# Patient Record
Sex: Male | Born: 1940 | Race: White | Hispanic: No | Marital: Married | Smoking: Former smoker
Health system: Southern US, Community
[De-identification: ages and names within clinical notes are randomized; demographics above are authoritative.]

## PROBLEM LIST (undated history)

## (undated) DIAGNOSIS — E785 Hyperlipidemia, unspecified: Secondary | ICD-10-CM

## (undated) DIAGNOSIS — I10 Essential (primary) hypertension: Secondary | ICD-10-CM

## (undated) DIAGNOSIS — I2510 Atherosclerotic heart disease of native coronary artery without angina pectoris: Secondary | ICD-10-CM

## (undated) DIAGNOSIS — I219 Acute myocardial infarction, unspecified: Secondary | ICD-10-CM

## (undated) DIAGNOSIS — Z9289 Personal history of other medical treatment: Secondary | ICD-10-CM

## (undated) HISTORY — DX: Atherosclerotic heart disease of native coronary artery without angina pectoris: I25.10

## (undated) HISTORY — DX: Hyperlipidemia, unspecified: E78.5

## (undated) HISTORY — DX: Personal history of other medical treatment: Z92.89

## (undated) HISTORY — DX: Essential (primary) hypertension: I10

## (undated) HISTORY — DX: Acute myocardial infarction, unspecified: I21.9

---

## 1990-12-20 HISTORY — PX: CORONARY ARTERY BYPASS GRAFT: SHX141

## 1999-12-07 ENCOUNTER — Emergency Department (HOSPITAL_COMMUNITY): Admission: EM | Admit: 1999-12-07 | Discharge: 1999-12-07 | Payer: Self-pay | Admitting: *Deleted

## 2000-05-23 ENCOUNTER — Ambulatory Visit (HOSPITAL_COMMUNITY): Admission: RE | Admit: 2000-05-23 | Discharge: 2000-05-23 | Payer: Self-pay | Admitting: Gastroenterology

## 2005-01-07 ENCOUNTER — Encounter: Admission: RE | Admit: 2005-01-07 | Discharge: 2005-01-07 | Payer: Self-pay | Admitting: Cardiovascular Disease

## 2005-10-02 IMAGING — CR DG CHEST 2V
2 series · 2 of 2 positions shown · non-contrast
Comparison: none

CLINICAL DATA: Pre-cath. 
 CHEST X-RAY: 
 Two views of the chest show the lungs to be clear.  There is mild cardiomegaly present.  Median sternotomy sutures are noted from prior CABG.

[view not recorded (1 of 2)]
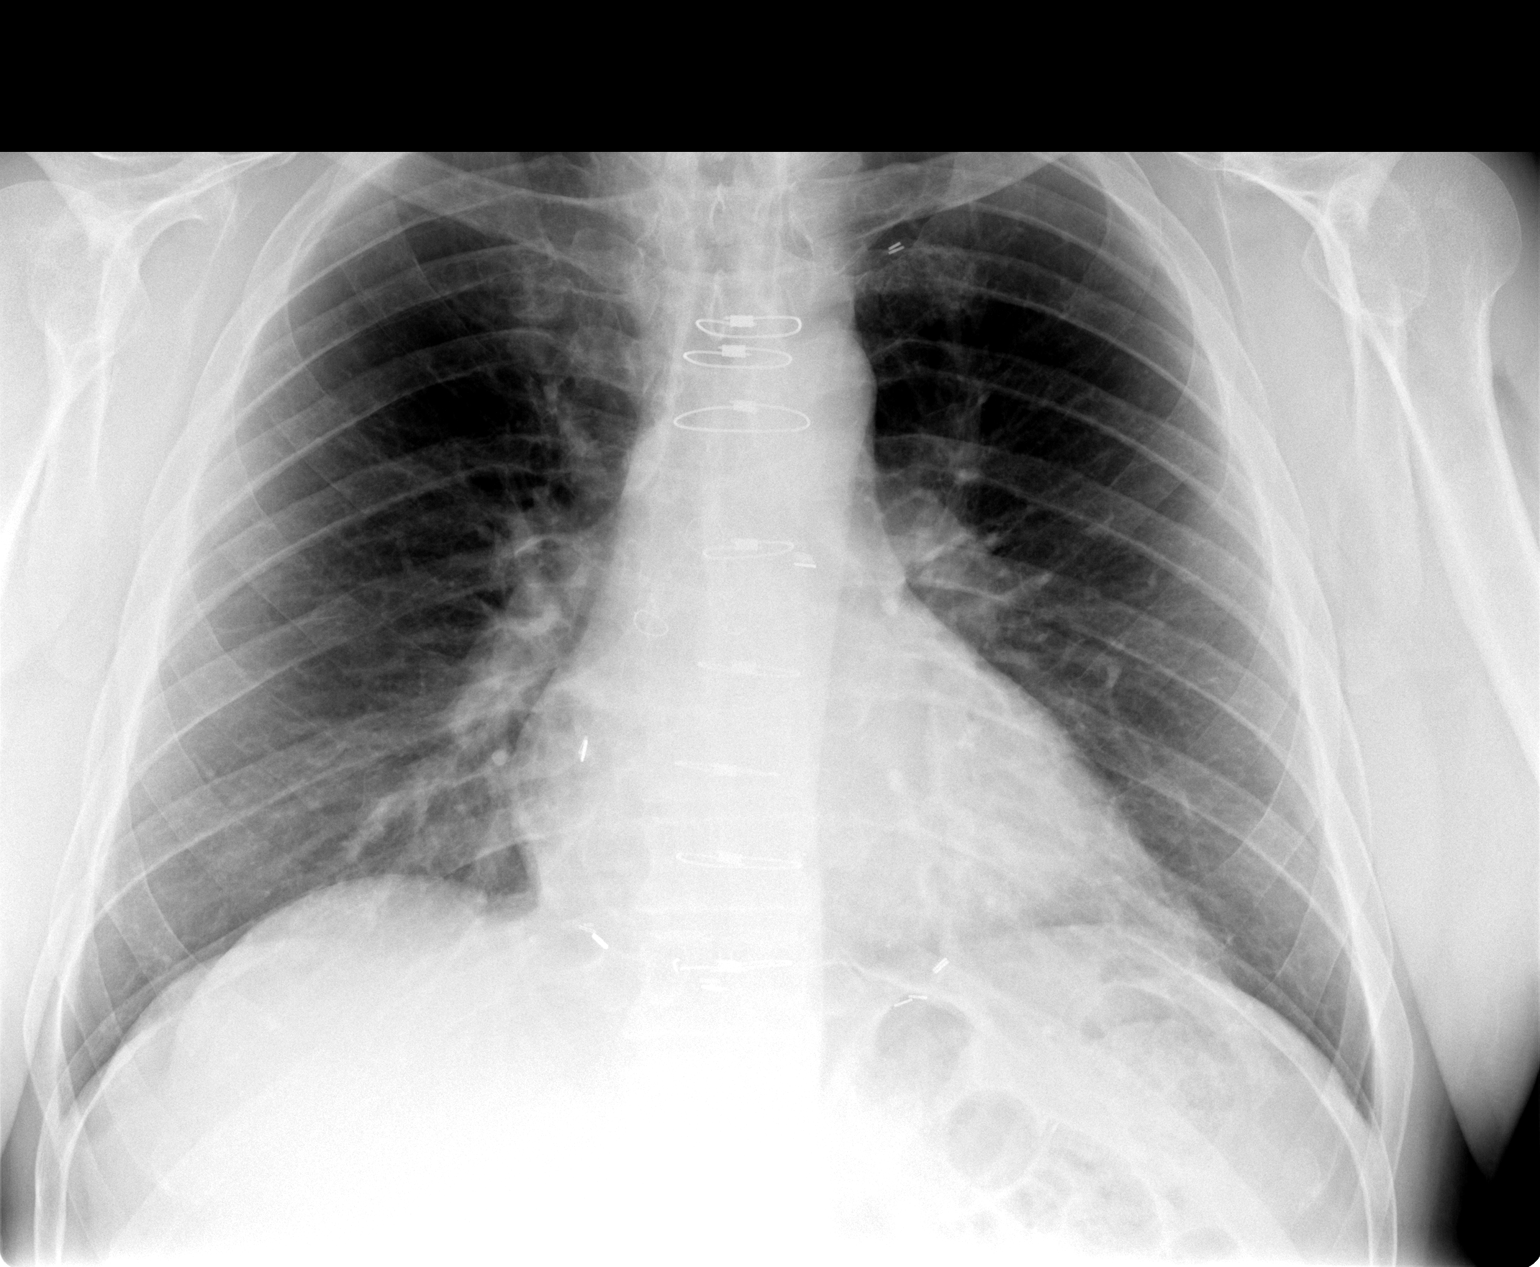

[view not recorded (2 of 2)]
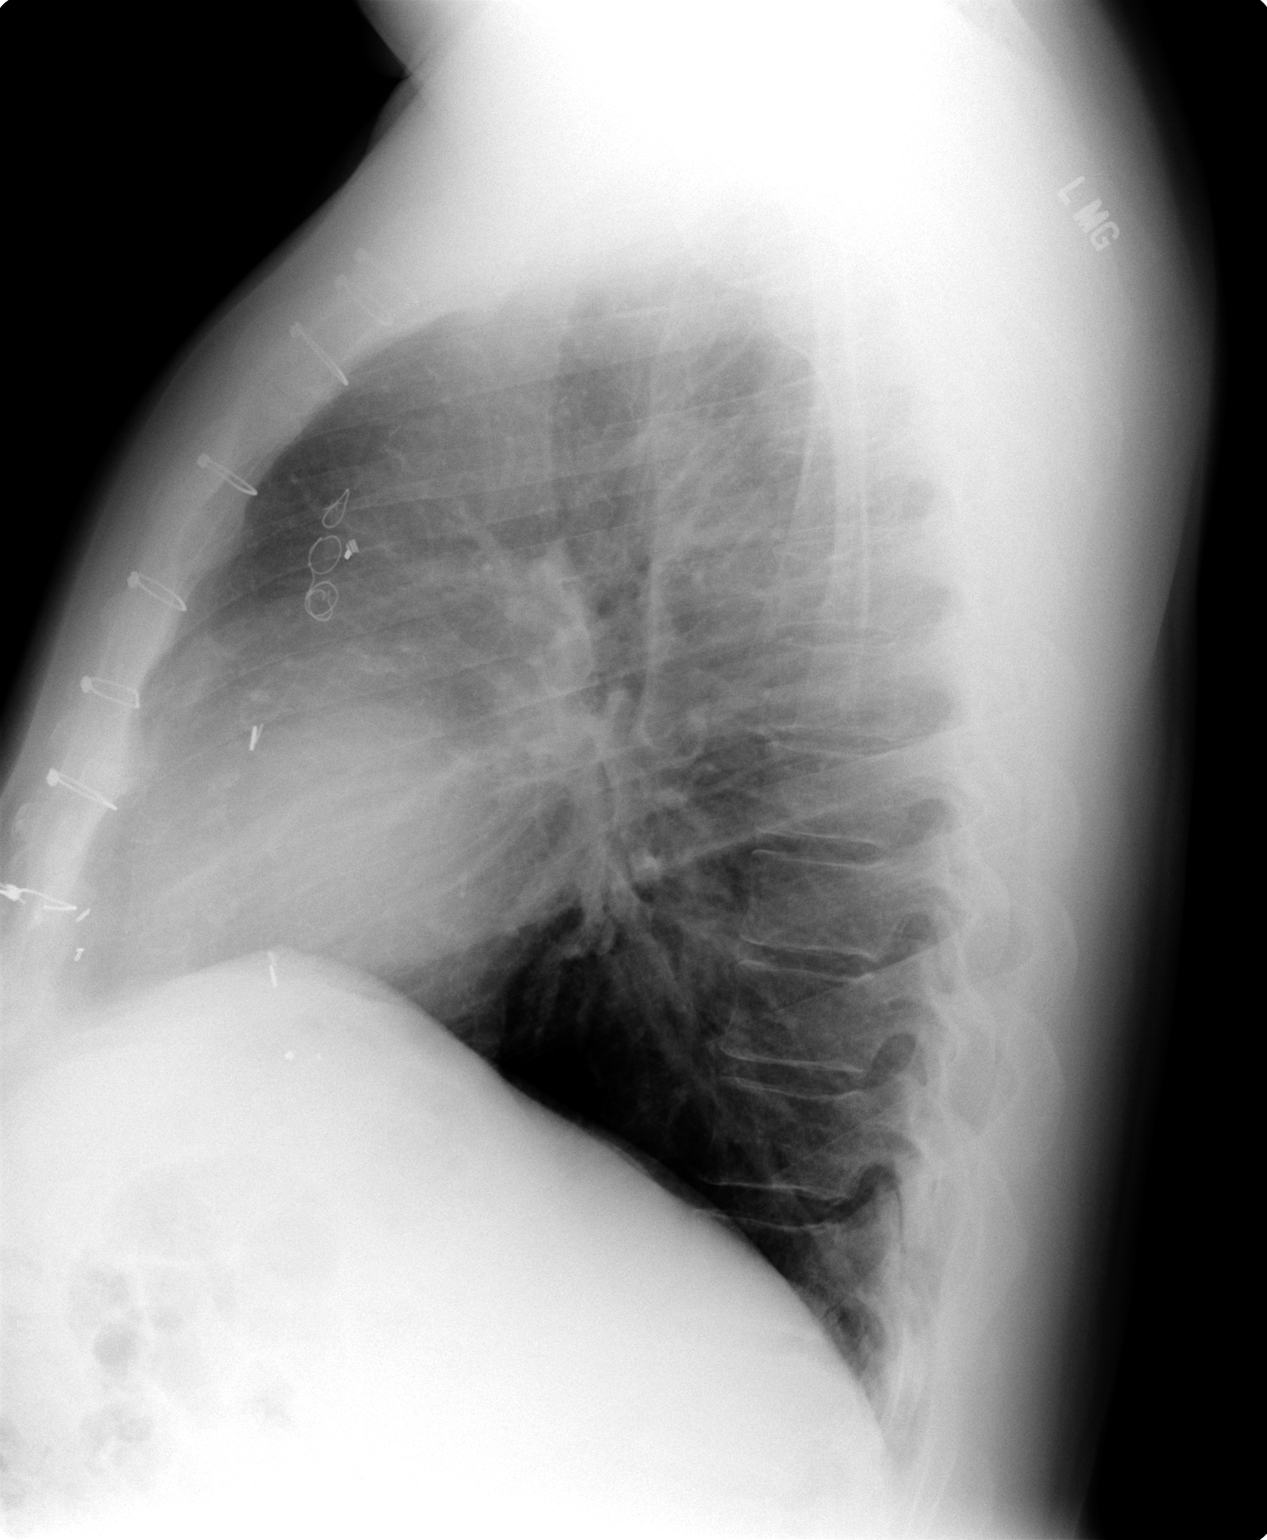

[2 of 2 positions shown; findings below may reference images not displayed]

IMPRESSION: No active lung disease.  Borderline cardiomegaly.

## 2005-12-20 HISTORY — PX: CARDIAC CATHETERIZATION: SHX172

## 2006-03-10 ENCOUNTER — Inpatient Hospital Stay (HOSPITAL_COMMUNITY): Admission: AD | Admit: 2006-03-10 | Discharge: 2006-03-12 | Payer: Self-pay | Admitting: Cardiology

## 2008-08-20 DIAGNOSIS — Z9289 Personal history of other medical treatment: Secondary | ICD-10-CM

## 2008-08-20 HISTORY — DX: Personal history of other medical treatment: Z92.89

## 2008-08-25 ENCOUNTER — Emergency Department (HOSPITAL_BASED_OUTPATIENT_CLINIC_OR_DEPARTMENT_OTHER): Admission: EM | Admit: 2008-08-25 | Discharge: 2008-08-25 | Payer: Self-pay | Admitting: Emergency Medicine

## 2009-06-19 DIAGNOSIS — Z9289 Personal history of other medical treatment: Secondary | ICD-10-CM

## 2009-06-19 HISTORY — DX: Personal history of other medical treatment: Z92.89

## 2011-05-07 NOTE — Cardiovascular Report (Signed)
NAME:  Carl Keith, Carl Keith NO.:  [id number]   MEDICAL RECORD NO.:  [id number]          PATIENT TYPE:  INP   LOCATION:  6522                         FACILITY:  [facility]   PHYSICIAN:  Nicki Guadalajara, M.D.     DATE OF BIRTH:  December 05, 1941   DATE OF PROCEDURE:  03/11/2006  DATE OF DISCHARGE:  03/12/2006                              CARDIAC CATHETERIZATION   INDICATIONS:  Mr. Carl Keith is a very pleasant 70 year old gentleman  who underwent CABG surgery x4 in 1992 by Dr. Andrey Campanile. In January 2006, after  a Cardiolite study suggested inferolateral ischemia in the apical  inferolateral segment which was new compared to 2003, he went repeat cardiac  catheterization at the [facility]. This showed preserved global  contractility with an EF of 53% with a small focal region of apical distal  inferior hypocontractility. He has significant native coronary artery  disease with ectasia of his left main, total occlusion of his proximal LAD  after a large bifurcating diagonal vessel and intermediate like vessel,  occlusion of his marginals rise from his native circumflex, and total  occlusion of his proximal right coronary artery. He had patent vein graft  supplying a large bifurcating diagonal system proximally. He had a patent  vein graft supplying the distal diagonal LAD system but had diffuse  irregularity beyond the graft insertion in the LAD with total occlusion of  the LAD distally with faint collateralization to the LAD portion. He had an  occluded third graft at its origin from the aorta of questionable insertion  site and he had a patent vein graft supplying the PDA but had diffuse  ectasia with distal stenosis of 50-60% and 90% in the mid-PDA stenosis, a  small caliber vessel. He was treated with medical therapy and continued to  do exceptionally well. Apparently two days ago, he developed new recurrent  discomfort with chest pain between his shoulder blades which  led to  admission to a [facility]. He ultimately ruled in for a non-Q MI.  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   PROCEDURE:  After premedication with Valium 5 mg intravenously, the patient  was prepped and draped in the usual fashion. His right femoral artery was  punctured anteriorly and a 5-French sheath was inserted. Diagnostic  catheterization was done with a 5-French Judkins #4 left and right  diagnostic catheters. A right catheter was used for selective angiography  into three of the vein grafts. A right bypass catheter was used for  selective angiography in the saphenous vein to the RCA. This was also used  for selective angiography to the left subclavian system for which a LIMA  could not be seen. A 5-French pigtail catheter was used for biplane cine  left ventriculography. Since the patient had recently had distal aortography  14 months ago, this was not repeated. At this point, I broke scrub. I did  review the cineangiograms with the patient and discussed the therapeutic  options. I also reviewed the angiographic data with Dr. Allyson Sabal and after some  discussion decision was made to attempt coronary intervention. At that time,  the staff had already begun to break sterility in the room. Consequently,  new system was set up and discussions were made with the patient to who  consented to have attempt at coronary intervention. The arterial sheath was  then exchanged for a 6-French sheath. A 6-French right bypass guide was used  for the intervention. An ATW marker wire was advanced down the vein graft to  the PDA vessel after the patient had been administered Integrilin bolus as  well as weight-adjusted heparinization with documented therapeutic ACT of  296.  He also had received 300 mg of oral Plavix. The patient also  received  an additional bolus of Integrilin and was begun on an Integrilin drip during  the procedure. Predilatation was done with a 2.0 x 15 mm Maverick balloon in  the PDA vessel beyond the anastomosis site to cover several areas of  stenosis and ectasia. It was felt that this was stentable. The patient did  receive numerous doses of intracoronary nitroglycerin. Following dilatation,  the vessel was large enough to stent and a 2.5 x 23 mm drug-eluting Cypher  stent was then successfully deployed and positioned just beyond the  anastomosis site to cover the ectatic segments and the sequential stenoses  of 50-70% and greater than 95%. Poststent dilatation was done utilizing a  2.75 x 15 mm Quantum balloon with tapering of the vessel from 2.75 mm  immediately after the anastomosis to 2.69 mm at the distal portion of the  stent. There was TIMI III flow. There was no evidence for dissection. The  patient tolerated the procedure well.   HEMODYNAMIC DATA:  Central aortic pressure was 125/66. Left ventricular  pressure was 125/16.   ANGIOGRAPHIC DATA:  The left main coronary artery was markedly ectatic and  bifurcated into a proximal LAD and circumflex system. There was 30-40%  ostial narrowing of the left main with 40-50% narrowing distally.   The LAD was ectatic proximally and then filling was seen into the  bifurcating diagonal system.  The LAD was not visualized beyond this.   The circumflex vessel was small to moderate caliber and a marginal branch  was not visualized from this vessel.   The right coronary was totally occluded proximally.   The first vein graft superiorly supplied this bifurcating diagonal proximal  vessel which was angiographically normal.   The second vein was angiographically normal and supplied the more distal  second diagonal vessel. There was diffuse irregularity of the LAD after the diagonal and then this was totally occluded with faint apical LAD   collateralization.   The third vein was totally occluded at its origin. The fourth vein supplied  the mid-PDA and was free of significant disease. However, the PDA was  diffusely irregular with ectasia and narrowings of 50%, 70% and in greater  than 95% in the proximal to mid-portion of this vein with the distal PDA  being small caliber. However, there was moderate collateralization to an  apparent a marginal-type vessel from this PDA system.   The left subclavian system was angiographically normal. However, the LIMA  was not visualized and it was uncertain if this was taken down at surgery  but not used or just not present.   Biplane cine left ventriculography revealed preserved global contractility  with an ejection fraction in the 50-55% range. However, there was  focal mid  to basal hypocontractility and mid-anterolateral hypocontractility in the  RAO projection, and on the LAO projection there was posterolateral  hypocontractility at the mid-ventricular level.   Following percutaneous coronary intervention via the saphenous vein graft to  the PDA with PTCA, stenting with a 2.5 x 23 mm drug-eluting Cypher stent and  poststent dilatation utilizing a 2.75 mm Quantum balloon, the PDA was  successfully stented with a residual narrowing of 0% with a stent placed  just beyond the anastomosis site. There was TIMI III flow. There was no  evidence for dissection.   IMPRESSION:  1.  Preserved global contractility with focal mid to basal posterior      hypocontractility, mild posterolateral hypocontractility.  2.  Significant native coronary obstructive disease with ectasia of the left      main left anterior descending artery system, 30% ostial left main, 50%      distal left main, total occlusion of the proximal circumflex, small      circumflex vessel without opacification of the marginal system      antegrade, total occlusion of the proximal right coronary artery.  3.  Patent vein graft  supplying a large bifurcating proximal      diagonal/intermediate like system.  4.  Patent saphenous vein graft supplying a distal diagonal vessel with      diffuse irregularity of the left anterior descending artery just beyond      this and with total occlusion of the left anterior descending artery      beyond the insertion with faint collateralization.  5.  Occluded third vein graft.  6.  Patent saphenous vein graft supplying the mid-posterior descending      artery with diffuse ectasia and narrowing of 50%, 70% and in greater      than 95% in the posterior descending vessel which also collateralizes      and supplies a marginal vessel.  7.  Successful percutaneous transluminal coronary angioplasty/stenting of      this posterior descending artery system beyond the saphenous vein graft      with ultimate insertion of a 2.5 x 23 mm Cypher stent postdilated to     2.75 mm done with double bolus Integrilin/weight-adjusted      heparinization, as well as oral Plavix.           ______________________________  Nicki Guadalajara, M.D.     TK/MEDQ  D:  03/11/2006  T:  03/12/2006  Job:  [id number]   cc:   Richardson Landry  Fax: [phone]   [facility]   Victoriano Lain, M.D.

## 2011-05-07 NOTE — Discharge Summary (Signed)
NAME:  Carl Keith, BUNTE NO.:  [id number]   MEDICAL RECORD NO.:  [id number]          PATIENT TYPE:  INP   LOCATION:  6522                         FACILITY:  [facility]   PHYSICIAN:  Nicki Guadalajara, M.D.     DATE OF BIRTH:  05-30-1941   DATE OF ADMISSION:  03/10/2006  DATE OF DISCHARGE:  03/12/2006                                 DISCHARGE SUMMARY   DISCHARGE DIAGNOSIS:  1.  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  2.  Coronary disease, coronary artery bypass grafting in 1992, distal right      coronary artery Cypher stenting via the saphenous vein graft this      admission.  3.  Preserved left ventricular function.  4.  Treated hypertension.  5.  Treated hyperlipidemia.   HOSPITAL COURSE:  The patient is a 70 year old male followed by Dr. Tresa Endo.  He had bypass surgery in 1992.  In January 2006 he was catheterized and had  an occluded vein graft in the circumflex.  His other vein grafts were  patent.  He did have some distal RCA disease.  He was doing well when we saw  him in September 2006 in the office.  He was admitted to [facility]  March 09, 2006, with substernal chest pain and belching.  Initially it was  thought he might have gallbladder problems as he has a history of  cholelithiasis, but a second troponin came back 3.27.  The patient was  transferred to [facility].  He was admitted by Dr. Jacinto Halim on March 22 and started  on Lovenox and nitrates.  CKs peaked at 291 with 19 MBs.  He was set up for  diagnostic catheterization on March 11, 2006.  This revealed patent SVG to  the diagonal, patent SVG to the RCA, occluded vein graft to the circumflex  and what appeared to be a patent vein graft to the second diagonal but left  distal LAD occlusion with faint collaterals.  The distal native RCA had a  95% stenosis.  He underwent PCI stenting to the distal RCA via the SVG with  a Cypher stent.  He tolerated the procedure well.  Dr. Tresa Endo felt he could  be  discharged late on the 24th.   DISCHARGE MEDICATIONS:  1.  Metoprolol 50 mg twice a day.  2.  Coated coated aspirin daily.  3.  Norvasc 5 mg a day.  4.  Lipitor 40 mg a day.  5.  Altace 2.5 mg a day.  6.  Niaspan 1 g h.s.  7.  Zetia 10 mg a day.  8.  Imdur 30 mg a day.  9.  Plavix 75 mg a day.  10. Nitroglycerin sublingual p.r.n.   LABORATORY DATA:  EKG showed sinus rhythm with small inferior Q-waves.  White count 4.8, hemoglobin 14.8, hematocrit 42.80, platelets 123.  INR 1.0.  Sodium 133, potassium 3.5, BUN 10, creatinine 1.1.  LFTs showed a slight  elevation in his AST of 56, ALT of 35.  CKs peaked as noted at 291 with 19  MBs.  Lipid panel shows cholesterol 112, HDL 39, LDL 61.  TSH 1.49.   DISPOSITION:  The patient is discharged stable condition and will follow up  with Dr. Tresa Endo in a couple of weeks in the office.      Abelino Derrick, P.A.    ______________________________  Nicki Guadalajara, M.D.    Lenard Lance  D:  04/25/2006  T:  04/26/2006  Job:  [id number]

## 2011-05-07 NOTE — H&P (Signed)
NAME:  GAREY, ALLEVA NO.:  [id number]   MEDICAL RECORD NO.:  [id number]          PATIENT TYPE:  INP   LOCATION:  6522                         FACILITY:  [facility]   PHYSICIAN:  Nicki Guadalajara, M.D.     DATE OF BIRTH:  July 10, 1941   DATE OF ADMISSION:  03/10/2006  DATE OF DISCHARGE:  03/12/2006                                HISTORY & PHYSICAL   CHIEF COMPLAINT:  Chest pain.   HISTORY OF PRESENT ILLNESS:  The patient is a 70 year old male who was  transferred from [facility] after he was admitted there with an MI.  The patient has a history of coronary disease.  He had bypass surgery in  1992.  He was catheterized January 2006 after an abnormal Cardiolite.  Catheterization showed occluded SVG to the circumflex, patent SVG to the  first and second diagonal, patent SVG to the RCA, with distal RCA disease.  Last office visit was September 2006 and he was doing well at that time.  The patient was admitted March 09, 2006, with substernal chest pain which  was relieved with belching.  He also had some shoulder and back pain.  Initially, symptoms were felt possibly to be secondary to gallbladder  disease but a second troponin was positive.  He requested transfer to Dr.  Landry Dyke service at [facility].   PAST MEDICAL HISTORY:  Remarkable for dyslipidemia, treated hypertension.   CURRENT MEDICATIONS:  As of September 2006 on his last office visit are:  1.  Metoprolol 50 mg b.i.d.  2.  Norvasc 5 mg a day.  3.  Lipitor 40 mg a day.  4.  Altace 2.5 mg a day.  5.  Niaspan 1 g h.s.  6.  Aspirin daily.  7.  Zetia 10 mg a day.  8.  Imdur 30 mg a day.   He has no known drug allergies.   SOCIAL HISTORY:  He is married, he has two children and one grandchild.  He  is a nonsmoker.   FAMILY HISTORY:  Remarkable for diabetes.   REVIEW OF SYSTEMS:  Essentially remarkable for noted above.  He denies any  GI bleeding or melena.  He has not had a history of diabetes.  Denies any  thyroid problems.  He has not had syncope or tachycardia.  He does have  seasonal allergies.   PHYSICAL EXAMINATION:  VITAL SIGNS:  Blood pressure 140/88, pulse 70,  respirations 12.  GENERAL:  He is well-developed, well-nourished male in no distress.  HEENT:  Normocephalic, atraumatic.  Extraocular movements are intact,  sclerae are nonicteric.  He does wear glasses.  NECK:  Without bruit, no JVD.  CHEST:  Clear to auscultation percussion.  CARDIAC:  Reveals regular rate and rhythm without murmur, rub or gallop.  Normal S1, S2.  ABDOMEN:  Nontender, no hepatosplenomegaly.  EXTREMITIES:  Without edema.  Distal pulses are 2+/4.  NEUROLOGIC:  Grossly intact.  He is awake, alert, oriented, and cooperative.   IMPRESSION:  1.  Subendocardial myocardial infarction by troponins at [facility].  2.  Coronary artery bypass  grafting in 1992 with catheterization January      2006 showing 1/4 grafts occluded.  3.  Normal left ventricular function.  4.  Treated hyperlipidemia.  5.  Treated hypertension.  6.  History of cholelithiasis.   PLAN:  The patient was admitted by Dr. Jacinto Halim.  He was admitted and started  on Lovenox and nitrates.  Subsequent troponins were obtained.  He will  probably need diagnostic catheterization.      Abelino Derrick, P.A.    ______________________________  Nicki Guadalajara, M.D.    Lenard Lance  D:  04/25/2006  T:  04/25/2006  Job:  [id number]

## 2011-05-07 NOTE — Procedures (Signed)
[place]  Patient:    Carl Keith, Carl Keith                    MRN: [id number] Proc. Date: 05/23/00 Adm. Date:  [id number] Attending:  Deneen Harts CC:         Dr. Elvera Maria                           Procedure Report  PROCEDURE:  Colonoscopy.  INDICATION:  A 70 year old white male with rectal pain.  No prior colorectal neoplasia surveillance.  Symptoms occurring at times at night.  Bowel habits with increased frequency, two to three daily.  Denies hematochezia.  No family history of colorectal neoplasia.  He is undergoing colonoscopy for surveillance and to rule out pathology contributing to probable rectal spasm.  DESCRIPTION OF PROCEDURE:  After reviewing the nature of the procedure with the patient, including potential risks and complications, informed consent was signed.  The patient was premedicated, receiving IV sedation totalling Versed 12 mg, fentanyl 125 mcg administered in divided doses prior to and during the course of the procedure.  Using an Olympus pediatric PCF-140L video colonoscope, rectum was intubated after normal digital examination.  There was no evidence of perianal or intrarectal pathology to explain patients symptoms.  The scope was inserted and advanced around the entire length of the colon to the cecum, identified by the appendiceal orifice and ileocecal valve.  Intubation was difficult through a very tortuous, elongated sigmoid-left colon.  The patient was ultimately rotated into a right lateral decubitus position to achieve deep intubation of the cecum, which was identified by the appendiceal orifice, ileocecal valve. Preparation was good to the right colon, where it was fair.  Copious irrigation had adequate visualization of the right colon.  The scope was slowly withdrawn with careful inspection of the entire colon in a retrograde manner, including retroflexed view in the rectal vault.  No abnormality  was noted.  The scope was retroflexed in the vault.  Internal hemorrhoids were apparent.  No other abnormality noted.  The colon was decompressed, scope withdrawn.  The patient tolerated the procedure without difficulty, being maintained on Dayscope monitor, low-flow oxygen throughout.  Time 2, technical 2, preparation 2, total score equals 6.  ASSESSMENT: 1. Normal colonoscopy. 2. No colorectal neoplasia. 3. Internal hemorrhoids, possible contributing factor to episodic rectal    spasm.  RECOMMENDATION: 1. Annual Hemoccult, Dr. Elvera Maria. 2. Repeat colonoscopy in 10 years. 3. NuLev one to two SL q.4h. p.r.n. rectal pain. 4. Consider Anusol-HC suppository if symptoms of spasm or rectal bleeding    continue and/or develop. DD:  05/23/00 TD:  05/26/00 Job: [id number]

## 2012-11-02 ENCOUNTER — Other Ambulatory Visit (HOSPITAL_COMMUNITY): Payer: Self-pay

## 2012-11-02 DIAGNOSIS — I2510 Atherosclerotic heart disease of native coronary artery without angina pectoris: Secondary | ICD-10-CM

## 2012-11-21 ENCOUNTER — Ambulatory Visit (HOSPITAL_COMMUNITY)
Admission: RE | Admit: 2012-11-21 | Discharge: 2012-11-21 | Disposition: A | Discharge status: Home / Self Care | Payer: BC Managed Care – PPO | Source: Ambulatory Visit | Attending: Cardiovascular Disease | Admitting: Cardiovascular Disease

## 2012-11-21 DIAGNOSIS — I2510 Atherosclerotic heart disease of native coronary artery without angina pectoris: Secondary | ICD-10-CM

## 2012-11-21 DIAGNOSIS — I10 Essential (primary) hypertension: Secondary | ICD-10-CM | POA: Insufficient documentation

## 2012-11-21 DIAGNOSIS — E785 Hyperlipidemia, unspecified: Secondary | ICD-10-CM | POA: Insufficient documentation

## 2012-11-21 DIAGNOSIS — Z87891 Personal history of nicotine dependence: Secondary | ICD-10-CM | POA: Insufficient documentation

## 2012-11-21 DIAGNOSIS — I252 Old myocardial infarction: Secondary | ICD-10-CM | POA: Insufficient documentation

## 2012-11-21 MED ORDER — TECHNETIUM TC 99M SESTAMIBI GENERIC - CARDIOLITE
30.8000 | Freq: Once | INTRAVENOUS | Status: AC | PRN
  Administered 2012-11-21: 30.8 via INTRAVENOUS

## 2012-11-21 MED ORDER — TECHNETIUM TC 99M SESTAMIBI GENERIC - CARDIOLITE
11.0000 | Freq: Once | INTRAVENOUS | Status: AC | PRN
  Administered 2012-11-21: 11 via INTRAVENOUS

## 2012-11-21 NOTE — Procedures (Addendum)
 [facility] [address] [place] [address] [place] [address] [phone]  Cardiology Nuclear Med Study  Carl Keith is a 71 y.o. male     MRN : [id number]     DOB: 1941/07/19  Procedure Date: 11/21/2012  Nuclear Med Background Indication for Stress Test:  Graft Patency and Stent Patency History:  Prior MI and CAD Cardiac Risk Factors: History of Smoking, Hypertension and Lipids  Symptoms:  none   Nuclear Pre-Procedure Caffeine/Decaff Intake:  7:00pm NPO After: 5:00am   IV Site: R Hand  IV 0.9% NS with Angio Cath:  22g  Chest Size (in):  44 IV Started by: Koren Shiver, CNMT  Height: 6\' 1"  (1.854 m)  Cup Size: n/a  BMI:  Body mass index is 28.50 kg/(m^2). Weight:  216 lb (97.977 kg)   Tech Comments:  n/a    Nuclear Med Study 1 or 2 day study: 1 day  Stress Test Type:  Stress  Order Authorizing Provider:  Nicki Guadalajara, MD   Resting Radionuclide: Technetium 31m Sestamibi  Resting Radionuclide Dose: 11.0 mCi   Stress Radionuclide:  Technetium 71m Sestamibi  Stress Radionuclide Dose: 30.8 mCi           Stress Protocol Rest HR: 64 Stress HR: 139  Rest BP: 134/90 Stress BP: 179/80  Exercise Time (min): 08:00 METS: 10.10   Predicted Max HR: 147 bpm % Max HR: 94.56 bpm Rate Pressure Product: 21308   Dose of Adenosine (mg):  n/a Dose of Lexiscan: 0.4 mg  Dose of Atropine (mg): n/a Dose of Dobutamine: n/a mcg/kg/min (at max HR)  Stress Test Technologist: Esperanza Sheets, CCT Nuclear Technologist: Gonzella Lex, CNMT   Rest Procedure:  Myocardial perfusion imaging was performed at rest 45 minutes following the intravenous administration of Technetium 55m Sestamibi. Stress Procedure:  The patient performed treadmill exercise using a Bruce  Protocol for 08:00 minutes. The patient stopped due to SOB and denied any chest pain.  There were no significant ST-T wave changes.  Technetium 26m Sestamibi was injected at peak exercise and  myocardial perfusion imaging was performed after a brief delay.  Transient Ischemic Dilatation (Normal <1.22):  1.01 Lung/Heart Ratio (Normal <0.45):  0.46 QGS EDV:  89 ml QGS ESV:  36 ml LV Ejection Fraction: 59%  Signed by      Rest ECG: NSR with non-specific ST-T wave changes  Stress ECG: No significant change from baseline ECG and No significant ST segment change suggestive of ischemia.  QPS Raw Data Images:  Normal; no motion artifact; normal heart/lung ratio. Stress Images:  There is a small in size and mild in intensity anterior apical defect. Rest Images:  Mild anterior apical defect is slightly more prominent on resting images suggesting a somecontribution of attenuation defect.  Subtraction (SDS):  No evidence of ischemia. Minimal region of apical scar; extent 5%.  Impression Exercise Capacity:  Good exercise capacity. BP Response:  Normal blood pressure response. Clinical Symptoms:  No significant symptoms noted. ECG Impression:  No significant ST segment change suggestive of ischemia. Rare isolated PVC. Comparison with Prior Nuclear Study: No significant change from previous study  Overall Impression:  Low risk stress nuclear study demonstrating a very small region of antero-apical scar without associated ischemia.  LV Wall Motion:  NL LV Function, EF 59%; NL Wall Motion   [name] A, MD  11/21/2012 5:56 PM

## 2013-04-12 ENCOUNTER — Encounter: Payer: Self-pay | Admitting: Cardiovascular Disease

## 2013-06-29 ENCOUNTER — Telehealth: Payer: Self-pay | Admitting: Cardiovascular Disease

## 2013-06-29 NOTE — Telephone Encounter (Signed)
Message forwarded to Dr. Tresa Endo.  This note w/ paper chart# [id number] on cart.

## 2013-06-29 NOTE — Telephone Encounter (Signed)
Carl Keith is calling from [facility] requesting to hold Plavix for 7 days for a colonoscopy. Can you please call him back.

## 2013-07-04 ENCOUNTER — Telehealth: Payer: Self-pay | Admitting: *Deleted

## 2013-07-04 NOTE — Telephone Encounter (Signed)
He does not need to call me back. I have spoken with Nick-PA-C with [facility] informing him okay per Dr. Tresa Endo to hold plavix for 7 days.

## 2013-07-04 NOTE — Telephone Encounter (Signed)
Okay to hold plavix for 7 days prior to colonoscopy.

## 2013-08-10 ENCOUNTER — Encounter: Payer: Self-pay | Admitting: *Deleted

## 2013-08-16 ENCOUNTER — Encounter: Payer: Self-pay | Admitting: Cardiovascular Disease

## 2013-08-16 ENCOUNTER — Ambulatory Visit (INDEPENDENT_AMBULATORY_CARE_PROVIDER_SITE_OTHER): Payer: BC Managed Care – PPO | Admitting: Cardiovascular Disease

## 2013-08-16 VITALS — BP 102/60 | HR 50 | Ht 73.5 in | Wt 196.6 lb

## 2013-08-16 DIAGNOSIS — E119 Type 2 diabetes mellitus without complications: Secondary | ICD-10-CM

## 2013-08-16 DIAGNOSIS — I10 Essential (primary) hypertension: Secondary | ICD-10-CM

## 2013-08-16 DIAGNOSIS — I2510 Atherosclerotic heart disease of native coronary artery without angina pectoris: Secondary | ICD-10-CM

## 2013-08-16 DIAGNOSIS — E785 Hyperlipidemia, unspecified: Secondary | ICD-10-CM

## 2013-08-16 NOTE — Patient Instructions (Addendum)
Your physician recommends that you schedule a follow-up appointment in: 6 MONTHS. No changes has been made today. 

## 2013-08-18 ENCOUNTER — Encounter: Payer: Self-pay | Admitting: Cardiovascular Disease

## 2013-08-18 DIAGNOSIS — I10 Essential (primary) hypertension: Secondary | ICD-10-CM | POA: Insufficient documentation

## 2013-08-18 DIAGNOSIS — I2510 Atherosclerotic heart disease of native coronary artery without angina pectoris: Secondary | ICD-10-CM | POA: Insufficient documentation

## 2013-08-18 DIAGNOSIS — E119 Type 2 diabetes mellitus without complications: Secondary | ICD-10-CM | POA: Insufficient documentation

## 2013-08-18 DIAGNOSIS — E785 Hyperlipidemia, unspecified: Secondary | ICD-10-CM | POA: Insufficient documentation

## 2013-08-18 NOTE — Progress Notes (Signed)
Patient ID: Carl Keith, male   DOB: 09-28-1941, 72 y.o.   MRN: [id number]     HPI: Carl Keith, is a 72 y.o. male who presents to the office today for a eight-month cardiology evaluation. I last saw him in December 2013. 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  Carl Keith has established coronary disease and apparently suffered a small heart attack initially at age 89 while living in [place].  In 1992 underwent CABG surgery x4 by Dr. Andrey Campanile after suffering a non-ST segment elevation myocardial infarction. 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 He had a pain vein graft supplying a large bifurcating diagonal system proximally and another pain graft supplying the distal diagonal LAD system but beyond the graft insertion was diffusely diseased occluded apical LAD portion. There is another graft that had been occluded. The vein graft supplying the PDA had was patent but there was diffuse PDA stenosis and a 03/11/2006 he underwent successful PCI with stenting to the PDA with insertion of 2.5x23 mm Cypher stent. Subsequent nuclear perfusion studies has have continued to show a small area of anteroapical scar concordant with his distal LAD occlusion. His last stress study was in December 2013 which was unchanged. Post-rest ejection fraction was 59%.   Carl Keith continues to feel well. He denies recent anginal symptoms. He denies any chest pain. He denies PND or orthopnea. He denies claudication he tells me he recently was just started on metformin 2 weeks ago by his primary physician.  Past Medical History  Diagnosis Date  . MI (myocardial infarction)     non-Q-wave, By Dr Andrey Campanile  . CAD (coronary artery disease)   . Hx  of echocardiogram 06/2009    which showed mild asymmetric LVH with low-normal EF50-55%, he had grade 1 diastolic dysfuntion, there was evidence for mild tricupid regurgitation, he had mild right atrial dilation and mild mitral annular calcification with trace MR.  Carl Keith Hypertension   . Hyperlipidemia   . History of stress test 08/2008    which show a defect in the mid distal apical anterior wall campatible with his distal LAD occlusion, perfusion was otherwise faily normal with mild thinning inferiorly.  Carl Keith History of stress test 11/2012    is unchanged shows a small in size, mild intensity defect without evidence for ischemia, 59% EF.    Past Surgical History  Procedure Laterality Date  . Coronary artery bypass graft  1992    x4 by Dr Andrey Campanile  . Cardiac catheterization  2007    he underwent stenting of his PDA vessel beyond his SVG to RCA graft 2.5 x 23 mm    No Known Allergies  Current Outpatient Prescriptions  Medication Sig Dispense Refill  . amLODipine (NORVASC) 5 MG tablet Take 5 mg by mouth daily.      Carl Keith aspirin 81 MG tablet Take 81 mg by mouth daily.      Carl Keith atorvastatin (LIPITOR) 40 MG tablet Take 40 mg by mouth daily.      Carl Keith BAYER CONTOUR NEXT TEST test strip       . BAYER MICROLET LANCETS lancets       . clopidogrel (PLAVIX) 75 MG tablet Take 75 mg by  mouth daily.      Carl Keith ezetimibe (ZETIA) 10 MG tablet Take 10 mg by mouth daily.      . isosorbide mononitrate (IMDUR) 30 MG 24 hr tablet Take 30 mg by mouth daily.      . metFORMIN (GLUCOPHAGE) 500 MG tablet Take 500 mg by mouth daily.      . metoprolol (LOPRESSOR) 50 MG tablet Take 50 mg by mouth 2 (two) times daily.      . niacin (NIASPAN) 1000 MG CR tablet Take 1,000 mg by mouth at bedtime.      . ramipril (ALTACE) 2.5 MG capsule Take 2.5 mg by mouth daily.      . vitamin C (ASCORBIC ACID) 500 MG tablet Take 500 mg by mouth daily.      . vitamin E 400 UNIT capsule Take 400 Units by mouth daily.       No current  facility-administered medications for this visit.    History   Social History  . Marital Status: Married    Spouse Name: Carl Keith    Number of Children: Carl Keith  . Years of Education: Carl Keith   Occupational History  . Not on file.   Social History Main Topics  . Smoking status: Former Games developer  . Smokeless tobacco: Never Used     Comment: quit aprox 10 years ago  . Alcohol Use: 1.0 oz/week    2 drink(s) per week     Comment: ocasional  . Drug Use: Not on file  . Sexual Activity: Not on file   Other Topics Concern  . Not on file   Social History Narrative  . No narrative on file    Family History  Problem Relation Age of Onset  . Diabetes Mother   . Cancer Father     ROS is negative for fevers, chills or night sweats. He denies visual symptoms. Denies chest pain. He denies shortness of breath. He denies tachycardia palpitations. He does exercise and walks at least 2 miles per day. He denies claudication. He denies significant myalgias. He tells me he did undergo a colonoscopy this past year and this was okay without problems. He did hold his Plavix for the procedure. He denies edema  Other system review is negative.  PE BP 102/60  Pulse 50  Ht 6' 1.5" (1.867 m)  Wt 196 lb 9.6 oz (89.177 kg)  BMI 25.58 kg/m2  General: Alert, oriented, no distress.  Skin: normal turgor, no rashes HEENT: Normocephalic, atraumatic. Pupils round and reactive; sclera anicteric;no lid lag.  Nose without nasal septal hypertrophy Mouth/Parynx benign; Mallinpatti scale 2 Neck: No JVD, no carotid briuts Lungs: clear to ausculatation and percussion; no wheezing or rales Heart: RRR, s1 s2 normal 1/6 systolic murmur, unchanged. Abdomen: soft, nontender; no hepatosplenomehaly, BS+; abdominal aorta nontender and not dilated by palpation. Pulses 2+ Extremities: no clubbing cyanosis or edema, Homan's sign negative  Neurologic: grossly nonfocal  ECG: Sinus bradycardia 50 beats per minute. No ectopy. QTc  interval 399 ms  LABS:  BMET No results found for this basename: na, k, cl, co2, glucose, bun, creatinine, calcium, gfrnonaa, gfraa     Hepatic Function Panel  No results found for this basename: prot, albumin, ast, alt, alkphos, bilitot, bilidir, ibili     CBC No results found for this basename: wbc, rbc, hgb, hct, plt, mcv, mch, mchc, rdw, neutrabs, lymphsabs, monoabs, eosabs, basosabs     BNP No results found for this basename: probnp    Lipid Panel  No  results found for this basename: chol, trig, hdl, cholhdl, vldl, ldlcalc     RADIOLOGY: No results found.    ASSESSMENT AND PLAN: Carl Keith continues to do well now 20 years status post revascularization surgery x4. He is documented distal LAD occlusion beyond the graft which is responsible for a small region of antero-apical scar. He presently is not having anginal symptoms. He is bradycardic but remains asymptomatic. He recently was diagnosed with type 2 diabetes mellitus as well as one single dose metformin 500 mg daily. His last lipid study in December showed a cholesterol of 103 triglycerides 76 LDL cholesterol 51 on current therapy. He will continue his current medical regimen. I will see him in 6 months for evaluation prior to that office visit repeat laboratory obtained.     Lennette Bihari, MD, [facility]  08/18/2013 1:20 PM

## 2014-03-01 ENCOUNTER — Other Ambulatory Visit: Payer: Self-pay | Admitting: Cardiovascular Disease

## 2014-03-01 NOTE — Telephone Encounter (Signed)
Rx was sent to pharmacy electronically. 

## 2014-03-13 ENCOUNTER — Encounter (INDEPENDENT_AMBULATORY_CARE_PROVIDER_SITE_OTHER): Payer: Self-pay

## 2014-04-18 ENCOUNTER — Ambulatory Visit (INDEPENDENT_AMBULATORY_CARE_PROVIDER_SITE_OTHER): Payer: BC Managed Care – PPO | Admitting: Cardiovascular Disease

## 2014-04-18 ENCOUNTER — Encounter: Payer: Self-pay | Admitting: Cardiovascular Disease

## 2014-04-18 VITALS — BP 110/70 | HR 50 | Ht 73.5 in | Wt 194.3 lb

## 2014-04-18 DIAGNOSIS — I10 Essential (primary) hypertension: Secondary | ICD-10-CM

## 2014-04-18 DIAGNOSIS — R5381 Other malaise: Secondary | ICD-10-CM

## 2014-04-18 DIAGNOSIS — I2510 Atherosclerotic heart disease of native coronary artery without angina pectoris: Secondary | ICD-10-CM

## 2014-04-18 DIAGNOSIS — E785 Hyperlipidemia, unspecified: Secondary | ICD-10-CM

## 2014-04-18 DIAGNOSIS — E119 Type 2 diabetes mellitus without complications: Secondary | ICD-10-CM

## 2014-04-18 DIAGNOSIS — R5383 Other fatigue: Secondary | ICD-10-CM

## 2014-04-18 DIAGNOSIS — N429 Disorder of prostate, unspecified: Secondary | ICD-10-CM

## 2014-04-18 LAB — TSH: TSH: 2.508 u[IU]/mL (ref 0.350–4.500)

## 2014-04-18 LAB — CBC
HEMATOCRIT: 44.8 % (ref 39.0–52.0)
HEMOGLOBIN: 15.8 g/dL (ref 13.0–17.0)
MCH: 30.7 pg (ref 26.0–34.0)
MCHC: 35.3 g/dL (ref 30.0–36.0)
MCV: 87 fL (ref 78.0–100.0)
Platelets: 151 10*3/uL (ref 150–400)
RBC: 5.15 MIL/uL (ref 4.22–5.81)
RDW: 12.8 % (ref 11.5–15.5)
WBC: 5.2 10*3/uL (ref 4.0–10.5)

## 2014-04-18 LAB — COMPREHENSIVE METABOLIC PANEL
ALK PHOS: 54 U/L (ref 39–117)
ALT: 37 U/L (ref 0–53)
AST: 38 U/L — AB (ref 0–37)
Albumin: 4.6 g/dL (ref 3.5–5.2)
BUN: 16 mg/dL (ref 6–23)
CO2: 27 mEq/L (ref 19–32)
CREATININE: 0.86 mg/dL (ref 0.50–1.35)
Calcium: 9.5 mg/dL (ref 8.4–10.5)
Chloride: 105 mEq/L (ref 96–112)
GLUCOSE: 102 mg/dL — AB (ref 70–99)
Potassium: 5.2 mEq/L (ref 3.5–5.3)
Sodium: 141 mEq/L (ref 135–145)
Total Bilirubin: 1 mg/dL (ref 0.2–1.2)
Total Protein: 6.7 g/dL (ref 6.0–8.3)

## 2014-04-18 LAB — LIPID PANEL
CHOL/HDL RATIO: 2.3 ratio
CHOLESTEROL: 97 mg/dL (ref 0–200)
HDL: 43 mg/dL (ref >39)
LDL Cholesterol: 45 mg/dL (ref 0–99)
TRIGLYCERIDES: 47 mg/dL (ref <150)
VLDL: 9 mg/dL (ref 0–40)

## 2014-04-18 LAB — HEMOGLOBIN A1C
Hgb A1c MFr Bld: 6.4 % — ABNORMAL HIGH (ref <5.7)
MEAN PLASMA GLUCOSE: 137 mg/dL — AB (ref <117)

## 2014-04-18 NOTE — Patient Instructions (Signed)
Your physician recommends that you schedule a follow-up appointment in: January 2016  Your physician recommends that you return for lab work fasting  Your physician has requested that you have en exercise stress myoview. For further information please visit [email or web address]. Please follow instruction sheet, as given. This will be done in January 2016

## 2014-04-18 NOTE — Progress Notes (Signed)
Patient ID: Carl Keith, male   DOB: 03-Apr-1941, 73 y.o.   MRN: [id number]      HPI: Carl Keith, is a 73 y.o. male who presents to the office today for a eight-month cardiology evaluation. I last saw him in December 2013. 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  Carl Keith has established coronary disease and apparently suffered a small heart attack initially at age 15 while living in [place].  In 1992 underwent CABG surgery x4 by Dr. Andrey Campanile after suffering a non-ST segment elevation myocardial infarction. 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 He had a pain vein graft supplying a large bifurcating diagonal system proximally and another pain graft supplying the distal diagonal LAD system but beyond the graft insertion was diffusely diseased occluded apical LAD portion. There is another graft that had been occluded. The vein graft supplying the PDA had was patent but there was diffuse PDA stenosis and a 03/11/2006 he underwent successful PCI with stenting to the PDA with insertion of 2.5x23 mm Cypher stent. Subsequent nuclear perfusion studies has have continued to show a small area of anteroapical scar concordant with his distal LAD occlusion. His last stress study was in December 2013 which was unchanged. Post-rest ejection fraction was 59%.   Carl Keith continues to feel well. He denies recent anginal symptoms. He denies any chest pain. He denies PND or orthopnea. He denies claudication.  History of hyperlipidemia and has been maintained on combination therapy of years consisting of atorvastatin, Zetia, and I am spinning.  Last year, he also started on metformin for type 2 diabetes mellitus.  Over the past 8  months, he has continued to be active.  He plans to retire in one year.  He is currently walking 2-20 miles per day.  Past Medical History  Diagnosis Date  . MI (myocardial infarction)     non-Q-wave, By Dr Andrey Campanile  . CAD (coronary artery disease)   . Hx of echocardiogram 06/2009    which showed mild asymmetric LVH with low-normal EF50-55%, he had grade 1 diastolic dysfuntion, there was evidence for mild tricupid regurgitation, he had mild right atrial dilation and mild mitral annular calcification with trace MR.  [name] Hypertension   . Hyperlipidemia   . History of stress test 08/2008    which show a defect in the mid distal apical anterior wall campatible with his distal LAD occlusion, perfusion was otherwise faily normal with mild thinning inferiorly.  [name] History of stress test 11/2012    is unchanged shows a small in size, mild intensity defect without evidence for ischemia, 59% EF.    Past Surgical History  Procedure Laterality Date  . Coronary artery bypass graft  1992    x4 by Dr Andrey Campanile  . Cardiac catheterization  2007    he underwent stenting of his PDA vessel beyond his SVG to RCA graft 2.5 x 23 mm    No Known Allergies  Current Outpatient Prescriptions  Medication Sig Dispense Refill  . amLODipine (NORVASC) 5 MG tablet TAKE 1 TABLET DAILY  90 tablet  1  . aspirin 81 MG tablet Take 81 mg by mouth daily.      [name] atorvastatin (LIPITOR) 40 MG  tablet TAKE 1 TABLET DAILY AT BEDTIME  90 tablet  1  . clopidogrel (PLAVIX) 75 MG tablet TAKE 1 TABLET DAILY  90 tablet  1  . isosorbide mononitrate (IMDUR) 30 MG 24 hr tablet TAKE 1 TABLET DAILY  90 tablet  1  . metFORMIN (GLUCOPHAGE) 500 MG tablet Take 500 mg by mouth daily.      . metoprolol (LOPRESSOR) 50 MG tablet TAKE 1 TABLET TWICE A DAY  180 tablet  1  . niacin (NIASPAN) 1000 MG CR tablet TAKE 1 TABLET DAILY AT BEDTIME  90 tablet  1  . ramipril (ALTACE) 2.5 MG capsule TAKE 1 CAPSULE DAILY  90 capsule  1  . vitamin C (ASCORBIC ACID)  500 MG tablet Take 500 mg by mouth daily.      . vitamin E 400 UNIT capsule Take 400 Units by mouth daily.      [name]. ZETIA 10 MG tablet TAKE 1 TABLET DAILY  90 tablet  1  . BAYER MICROLET LANCETS lancets        No current facility-administered medications for this visit.    History   Social History  . Marital Status: Married    Spouse Name: N/A    Number of Children: N/A  . Years of Education: N/A   Occupational History  . Not on file.   Social History Main Topics  . Smoking status: Former Games developermoker  . Smokeless tobacco: Never Used     Comment: quit aprox 10 years ago  . Alcohol Use: 1.0 oz/week    2 drink(s) per week     Comment: ocasional  . Drug Use: Not on file  . Sexual Activity: Not on file   Other Topics Concern  . Not on file   Social History Narrative  . No narrative on file    Family History  Problem Relation Age of Onset  . Diabetes Mother   . Cancer Father    ROS General: Negative; No fevers, chills, or night sweats;  HEENT: Negative; No changes in vision or hearing, sinus congestion, difficulty swallowing Pulmonary: Negative; No cough, wheezing, shortness of breath, hemoptysis Cardiovascular: Negative; No chest pain, presyncope, syncope, palpatations GI: Negative; No nausea, vomiting, diarrhea, or abdominal pain GU: Negative; No dysuria, hematuria, or difficulty voiding Musculoskeletal: Negative; no myalgias, joint pain, or weakness Hematologic/Oncology: Negative; no easy bruising, bleeding Endocrine: Negative; no heat/cold intolerance Neuro: Negative; no changes in balance, headaches Skin: Negative; No rashes or skin lesions Psychiatric: Negative; No behavioral problems, depression Sleep: Negative; No snoring, daytime sleepiness, hypersomnolence, bruxism, restless legs, hypnogognic hallucinations, no cataplexy Other comprehensive 14  Point system review is negative.  PE BP 110/70  Pulse 50  Ht 6' 1.5" (1.867 m)  Wt 194 lb 4.8 oz (88.134 kg)  BMI  25.28 kg/m2  General: Alert, oriented, no distress; appears younger than stated age Skin: normal turgor, no rashes HEENT: Normocephalic, atraumatic. Pupils round and reactive; sclera anicteric;no lid lag.  Nose without nasal septal hypertrophy Mouth/Parynx benign; Mallinpatti scale 2 Neck: No JVD, no carotid bruits with normal upstroke Chest wall: Nontender to palpation Lungs: clear to ausculatation and percussion; no wheezing or rales Heart: RRR, s1 s2 normal 1/6 systolic murmur, unchanged. Abdomen: soft, nontender; no hepatosplenomehaly, BS+; abdominal aorta nontender and not dilated by palpation. Back: No CVA tenderness Pulses 2+ Extremities: no clubbing cyanosis or edema, Homan's sign negative  Neurologic: grossly nonfocal Psychological: Normal affect and mood  ECG today (apparently read by me) NSR at 50 beats per  minute.  No ectopy.  Normal intervals.  Prior 08/17/2039 ECG: Sinus bradycardia 50 beats per minute. No ectopy. QTc interval 399 ms  LABS:  BMET No results found for this basename: na,  k,  cl,  co2,  glucose,  bun,  creatinine,  calcium,  gfrnonaa,  gfraa     Hepatic Function Panel  No results found for this basename: prot,  albumin,  ast,  alt,  alkphos,  bilitot,  bilidir,  ibili     CBC No results found for this basename: wbc,  rbc,  hgb,  hct,  plt,  mcv,  mch,  mchc,  rdw,  neutrabs,  lymphsabs,  monoabs,  eosabs,  basosabs     BNP No results found for this basename: probnp    Lipid Panel  No results found for this basename: chol,  trig,  hdl,  cholhdl,  vldl,  ldlcalc     RADIOLOGY: No results found.    ASSESSMENT AND PLAN: Mr. Manson PasseyBrown continues to do well now 21 years status post revascularization surgery x4. He is documented distal LAD occlusion beyond the graft which is responsible for a small region of antero-apical scar. He presently is not having anginal symptoms. He is bradycardic but remains asymptomatic.  He remains active and is  walking 2-1/2 miles per day without change in symptoms.  The past year, he was started on metformin for type 2 diabetes mellitus and has been taking this only at 500 mg daily.  He has lost significant weight or spotting.  The cervix is now near ideal at 25.3.  Blood sugar checks at home and been in the 90 to 115 range.  He also has hyperlipidemia, and this has been well-controlled on combination therapy.  It has been over a year since lab work has been drawn.  He cannot recall the last time his PSA was checked as well.  He is fasting today.  We'll check a CBC, comprehensive metabolic panel, lipid panel, TSH, hemoglobin A1c, as well as a PSA level.  His last nuclear perfusion study was in 2013.  I will see him in January 2016 at that time.  He will undergo an exercise Myoview scan and I plan to see him in follow up evaluation.  He is planning to retire in April 2016.    Lennette Biharihomas A. Kelly, MD, [facility]  04/18/2014 8:17 AM

## 2014-04-19 LAB — PSA: PSA: 4.04 ng/mL — AB (ref <=4.00)

## 2014-05-14 ENCOUNTER — Encounter: Payer: Self-pay | Admitting: *Deleted

## 2014-05-14 ENCOUNTER — Telehealth: Payer: Self-pay | Admitting: *Deleted

## 2014-05-14 NOTE — Telephone Encounter (Signed)
Called patient to give lab results and recommendations.  Voicemail states patient out on vacation until the beginning of June. I will send note with lab results and recommendations.

## 2014-05-14 NOTE — Telephone Encounter (Signed)
Message copied by Gaynelle Cage on Tue May 14, 2014  3:19 PM ------      Message from: Nicki Guadalajara A      Created: Sun May 05, 2014  9:40 PM       Metabolic syndrome; needs improved diet with glu increased. PSA mildly inc / has pt ever seen a urologist? ------

## 2014-07-21 ENCOUNTER — Other Ambulatory Visit: Payer: Self-pay | Admitting: Cardiovascular Disease

## 2014-07-22 NOTE — Telephone Encounter (Signed)
Rx refill sent to patient pharmacy   

## 2014-10-17 ENCOUNTER — Telehealth: Payer: Self-pay | Admitting: *Deleted

## 2014-10-17 NOTE — Telephone Encounter (Signed)
Faxed surgical clearance to [facility].@ [phone].

## 2014-12-16 ENCOUNTER — Telehealth: Payer: Self-pay | Admitting: Cardiovascular Disease

## 2014-12-16 NOTE — Telephone Encounter (Signed)
Received records from [facility] (Dr Barron Alvineavid Grapey) for appointment on 01/15/15 with Dr Tresa EndoKelly.  [facility] for Dr Landry DykeKelly's schedule on 01/15/15. lp

## 2015-01-15 ENCOUNTER — Ambulatory Visit: Payer: BC Managed Care – PPO | Admitting: Cardiovascular Disease

## 2015-03-24 ENCOUNTER — Ambulatory Visit (INDEPENDENT_AMBULATORY_CARE_PROVIDER_SITE_OTHER): Payer: Medicare Other | Admitting: Cardiovascular Disease

## 2015-03-24 ENCOUNTER — Encounter: Payer: Self-pay | Admitting: Cardiovascular Disease

## 2015-03-24 VITALS — BP 126/86 | HR 53 | Ht 73.5 in | Wt 198.6 lb

## 2015-03-24 DIAGNOSIS — Z79899 Other long term (current) drug therapy: Secondary | ICD-10-CM | POA: Diagnosis not present

## 2015-03-24 DIAGNOSIS — E119 Type 2 diabetes mellitus without complications: Secondary | ICD-10-CM | POA: Diagnosis not present

## 2015-03-24 DIAGNOSIS — E785 Hyperlipidemia, unspecified: Secondary | ICD-10-CM

## 2015-03-24 DIAGNOSIS — I10 Essential (primary) hypertension: Secondary | ICD-10-CM | POA: Diagnosis not present

## 2015-03-24 DIAGNOSIS — I2510 Atherosclerotic heart disease of native coronary artery without angina pectoris: Secondary | ICD-10-CM

## 2015-03-24 NOTE — Progress Notes (Signed)
Patient ID: Carl Keith, male   DOB: Mar 16, 1941, 74 y.o.   MRN: [id number]      HPI: Carl Keith is a 74 y.o. male who presents to the office today for one year cardiology evaluation.  Last month he retired from his position at [employer] in [employer].  Carl Keith has established CAD and suffered a small heart attack initially at age 43 while living in [place].  In 1992 underwent CABG surgery x4 by Dr. Redmond Pulling after suffering a non-ST segment elevation myocardial infarction. In March 2007 he was found to have significant  native CAD disease with an ectatic left main with 30% stenosis, 50% stenosis at the ostia of the LAD, occlusion of the marginals arising from the native circumflex and total occlusion of the RCA. He had a patent vein graft supplying a large bifurcating diagonal system proximally and another patent graft supplying the distal diagonal LAD system but beyond the graft insertion was diffusely diseased occluded apical LAD portion. There is another graft that had been occluded. The vein graft supplying the PDA had was patent but there was diffuse PDA stenosis and a 03/11/2006 he underwent successful PCI with stenting to the PDA with insertion of 2.5x23 mm Cypher stent. Subsequent nuclear perfusion studies has have continued to show a small area of anteroapical scar concordant with his distal LAD occlusion. His last stress study was in 12-25-2012 which was unchanged. Post-rest ejection fraction was 59%.   Carl Keith continues to feel well. He denies recent anginal symptoms. He denies any chest pain. He denies PND or orthopnea. He denies claudication.  History of hyperlipidemia and has been maintained on combination therapy of years consisting of atorvastatin, Zetia, and I am spinning.  Last year, he also started on metformin for type 2 diabetes mellitus.  Over the past year, Carl Keith has continued to be active.  He continues to walk at least 2 miles per day.  Last month he retired  from his job at [employer] after working many years in [employer].  Unfortunate, 6 weeks ago.  His 76 year old wife died with lung cancer.  In 2023-12-26, he had seen Dr. Jeralyn Ruths.  He did undergone a PSA level which was 4.22 and he was started on Flomax.  He presents for yearly evaluation  Past Medical History  Diagnosis Date  . MI (myocardial infarction)     non-Q-wave, By Dr Redmond Pulling  . CAD (coronary artery disease)   . Hx of echocardiogram 06/2009    which showed mild asymmetric LVH with low-normal EF50-55%, he had grade 1 diastolic dysfuntion, there was evidence for mild tricupid regurgitation, he had mild right atrial dilation and mild mitral annular calcification with trace MR.  [name] Hypertension   . Hyperlipidemia   . History of stress test 08/2008    which show a defect in the mid distal apical anterior wall campatible with his distal LAD occlusion, perfusion was otherwise faily normal with mild thinning inferiorly.  [name] History of stress test 25-Dec-2012    is unchanged shows a small in size, mild intensity defect without evidence for ischemia, 59% EF.    Past Surgical History  Procedure Laterality Date  . Coronary artery bypass graft  1992    x4 by Dr Redmond Pulling  . Cardiac catheterization  2007    he underwent stenting of his PDA vessel beyond his SVG to RCA graft 2.5 x 23 mm    No Known Allergies  Current Outpatient Prescriptions  Medication Sig Dispense Refill  .  amLODipine (NORVASC) 5 MG tablet TAKE 1 TABLET DAILY 90 tablet 2  . aspirin 81 MG tablet Take 81 mg by mouth daily.    [name] atorvastatin (LIPITOR) 40 MG tablet TAKE 1 TABLET DAILY AT BEDTIME 90 tablet 2  . BAYER MICROLET LANCETS lancets     . clopidogrel (PLAVIX) 75 MG tablet TAKE 1 TABLET DAILY 90 tablet 2  . isosorbide mononitrate (IMDUR) 30 MG 24 hr tablet TAKE 1 TABLET DAILY 90 tablet 2  . metFORMIN (GLUCOPHAGE) 500 MG tablet Take 500 mg by mouth daily.    . metoprolol (LOPRESSOR) 50 MG tablet TAKE 1 TABLET TWICE A DAY 180 tablet 2  .  ramipril (ALTACE) 2.5 MG capsule TAKE 1 CAPSULE DAILY 90 capsule 2  . tamsulosin (FLOMAX) 0.4 MG CAPS capsule Take 1 capsule by mouth at bedtime.  1  . vitamin C (ASCORBIC ACID) 500 MG tablet Take 500 mg by mouth daily.    . vitamin E 400 UNIT capsule Take 400 Units by mouth daily.    [name] ZETIA 10 MG tablet TAKE 1 TABLET DAILY 90 tablet 2   No current facility-administered medications for this visit.    History   Social History  . Marital Status: Married    Spouse Name: N/A  . Number of Children: N/A  . Years of Education: N/A   Occupational History  . Not on file.   Social History Main Topics  . Smoking status: Former Research scientist (life sciences)  . Smokeless tobacco: Never Used     Comment: quit aprox 10 years ago  . Alcohol Use: 1.0 oz/week    2 drink(s) per week     Comment: ocasional  . Drug Use: Not on file  . Sexual Activity: Not on file   Other Topics Concern  . Not on file   Social History Narrative    Family History  Problem Relation Age of Onset  . Diabetes Mother   . Cancer Father    ROS General: Negative; No fevers, chills, or night sweats;  HEENT: Negative; No changes in vision or hearing, sinus congestion, difficulty swallowing Pulmonary: Negative; No cough, wheezing, shortness of breath, hemoptysis Cardiovascular: Negative; No chest pain, presyncope, syncope, palpatations GI: Negative; No nausea, vomiting, diarrhea, or abdominal pain GU: Negative; No dysuria, hematuria, or difficulty voiding Musculoskeletal: Negative; no myalgias, joint pain, or weakness Hematologic/Oncology: Negative; no easy bruising, bleeding Endocrine: Negative; no heat/cold intolerance Neuro: Negative; no changes in balance, headaches Skin: Negative; No rashes or skin lesions Psychiatric: Negative; No behavioral problems, depression Sleep: Negative; No snoring, daytime sleepiness, hypersomnolence, bruxism, restless legs, hypnogognic hallucinations, no cataplexy Other comprehensive 14  Point system  review is negative.  PE BP 126/86 mmHg  Pulse 53  Ht 6' 1.5" (1.867 m)  Wt 198 lb 9.6 oz (90.084 kg)  BMI 25.84 kg/m2  General: Alert, oriented, no distress; appears younger than stated age Skin: normal turgor, no rashes HEENT: Normocephalic, atraumatic. Pupils round and reactive; sclera anicteric;no lid lag.  Nose without nasal septal hypertrophy Mouth/Parynx benign; Mallinpatti scale 2 Neck: No JVD, no carotid bruits with normal upstroke Chest wall: Nontender to palpation Lungs: clear to ausculatation and percussion; no wheezing or rales Heart: RRR, s1 s2 normal 1/6 systolic murmur, unchanged.  No S3 or S4 gallop.  No diastolic murmurs rubs thrills or heaves Abdomen: soft, nontender; no hepatosplenomehaly, BS+; abdominal aorta nontender and not dilated by palpation. Back: No CVA tenderness Pulses 2+ Extremities: no clubbing cyanosis or edema, Homan's sign negative  Neurologic: grossly  nonfocal Psychological: Normal affect and mood   ECG (independently read by me): Sinus bradycardia 53 bpm.  No ectopy.  Poor anterior R-wave progression V1 through V3.  April 2015 ECG today (apparently read by me) NSR at 50 beats per minute.  No ectopy.  Normal intervals.  Prior 08/17/2039 ECG: Sinus bradycardia 50 beats per minute. No ectopy. QTc interval 399 ms  LABS:  BMET  BMP Latest Ref Rng 04/18/2014  Glucose 70 - 99 mg/dL 102(H)  BUN 6 - 23 mg/dL 16  Creatinine 0.50 - 1.35 mg/dL 0.86  Sodium 135 - 145 mEq/L 141  Potassium 3.5 - 5.3 mEq/L 5.2  Chloride 96 - 112 mEq/L 105  CO2 19 - 32 mEq/L 27  Calcium 8.4 - 10.5 mg/dL 9.5    Hepatic Function Panel   Hepatic Function Latest Ref Rng 04/18/2014  Total Protein 6.0 - 8.3 g/dL 6.7  Albumin 3.5 - 5.2 g/dL 4.6  AST 0 - 37 U/L 38(H)  ALT 0 - 53 U/L 37  Alk Phosphatase 39 - 117 U/L 54  Total Bilirubin 0.2 - 1.2 mg/dL 1.0   CBC  CBC Latest Ref Rng 04/18/2014  WBC 4.0 - 10.5 K/uL 5.2  Hemoglobin 13.0 - 17.0 g/dL 15.8  Hematocrit  39.0 - 52.0 % 44.8  Platelets 150 - 400 K/uL 151     BNP No results found for: PROBNP    Lipid Panel     Component Value Date/Time   CHOL 97 04/18/2014 0817   TRIG 47 04/18/2014 0817   HDL 43 04/18/2014 0817   CHOLHDL 2.3 04/18/2014 0817   VLDL 9 04/18/2014 0817   LDLCALC 45 04/18/2014 0817     RADIOLOGY: No results found.    ASSESSMENT AND PLAN: Carl Keith is a 74 year old gentleman who is now 24 years status post revascularization surgery x4. He is documented distal LAD occlusion beyond the graft which is responsible for a small region of antero-apical scar. He presently is not having anginal symptoms. He is bradycardic but remains asymptomatic.  He remains active and is walking 2 miles per day without change in symptoms.  Last year, laboratory revealed very aggressive lipid management with LDL cholesterol 45.  He has been on dose metformin.  4.  Temp 2 diabetes mellitus.  He tells me his blood sugar this morning at home was 106.  He denies any anginal symptoms.  He continues to be on dual antiplatelet therapy with aspirin and Plavix.  His blood pressure today is controlled on amlodipine 5 mg ramipril 2.5 mg and metoprolol 50 g twice a day.  He has been on Zetia 10 mg, atorvastatin 40 mg, in addition to niaspan1000 mg for his hyperlipidemia.  With his significantly low, triglycerides and LDL I have recommended that he discontinue his niacin preparation.  In 6 weeks, he will undergo a one-year follow-up laboratory and further adjustments will be made at that time.  His last nuclear perfusion study was in December 2013.  In one year, I am recommending he undergo a follow-up nuclear perfusion scan.  Will be over 3 years since his last study and 25 years since his CABG surgery.  I will see him in follow-up and further conditioning made at that time.  Troy Sine, MD, [facility]  03/24/2015 8:29 AM

## 2015-03-24 NOTE — Patient Instructions (Signed)
Your physician has recommended you make the following change in your medication: STOP the niaspan.  Your physician recommends that you return for lab work in: 6 weeks.  Your physician has requested that you have en exercise stress myoview and office visit in 1 year.

## 2015-04-21 ENCOUNTER — Ambulatory Visit
Admission: RE | Admit: 2015-04-21 | Discharge: 2015-04-21 | Disposition: A | Discharge status: Home / Self Care | Payer: Medicare Other | Source: Ambulatory Visit | Attending: Family Medicine | Admitting: Family Medicine

## 2015-04-21 ENCOUNTER — Other Ambulatory Visit: Payer: Self-pay | Admitting: Family Medicine

## 2015-04-21 DIAGNOSIS — R52 Pain, unspecified: Secondary | ICD-10-CM

## 2015-05-01 ENCOUNTER — Encounter: Payer: Self-pay | Admitting: Cardiovascular Disease

## 2015-05-06 LAB — CBC
HCT: 43.4 % (ref 39.0–52.0)
Hemoglobin: 15 g/dL (ref 13.0–17.0)
MCH: 30.5 pg (ref 26.0–34.0)
MCHC: 34.6 g/dL (ref 30.0–36.0)
MCV: 88.2 fL (ref 78.0–100.0)
MPV: 11 fL (ref 8.6–12.4)
Platelets: 129 10*3/uL — ABNORMAL LOW (ref 150–400)
RBC: 4.92 MIL/uL (ref 4.22–5.81)
RDW: 13.5 % (ref 11.5–15.5)
WBC: 4.9 10*3/uL (ref 4.0–10.5)

## 2015-05-06 LAB — COMPREHENSIVE METABOLIC PANEL
ALK PHOS: 44 U/L (ref 39–117)
ALT: 28 U/L (ref 0–53)
AST: 30 U/L (ref 0–37)
Albumin: 4.1 g/dL (ref 3.5–5.2)
BUN: 15 mg/dL (ref 6–23)
CALCIUM: 9.1 mg/dL (ref 8.4–10.5)
CO2: 26 mEq/L (ref 19–32)
Chloride: 105 mEq/L (ref 96–112)
Creat: 0.79 mg/dL (ref 0.50–1.35)
Glucose, Bld: 89 mg/dL (ref 70–99)
Potassium: 5.7 mEq/L — ABNORMAL HIGH (ref 3.5–5.3)
SODIUM: 140 meq/L (ref 135–145)
Total Bilirubin: 1 mg/dL (ref 0.2–1.2)
Total Protein: 6.4 g/dL (ref 6.0–8.3)

## 2015-05-06 LAB — HEMOGLOBIN A1C
Hgb A1c MFr Bld: 6.6 % — ABNORMAL HIGH (ref <5.7)
MEAN PLASMA GLUCOSE: 143 mg/dL — AB (ref <117)

## 2015-05-06 LAB — LIPID PANEL
CHOL/HDL RATIO: 2.2 ratio
Cholesterol: 97 mg/dL (ref 0–200)
HDL: 45 mg/dL (ref >=40)
LDL Cholesterol: 40 mg/dL (ref 0–99)
Triglycerides: 60 mg/dL (ref <150)
VLDL: 12 mg/dL (ref 0–40)

## 2015-05-06 LAB — TSH: TSH: 2.876 u[IU]/mL (ref 0.350–4.500)

## 2015-05-15 ENCOUNTER — Telehealth: Payer: Self-pay | Admitting: *Deleted

## 2015-05-15 NOTE — Telephone Encounter (Signed)
Left message to return a call to discuss lab results. 

## 2015-05-15 NOTE — Telephone Encounter (Signed)
-----   Message from Thomas A Kelly, MD sent at 05/11/2015 10:01 PM EDT ----- Re-check K; avoid foodswith inc K; if still elevated on recheck may need to dc altace; HbA1c 6.6 

## 2015-05-16 ENCOUNTER — Telehealth: Payer: Self-pay | Admitting: Cardiovascular Disease

## 2015-05-16 DIAGNOSIS — R6889 Other general symptoms and signs: Secondary | ICD-10-CM

## 2015-05-16 NOTE — Telephone Encounter (Signed)
Left message to go to lab on Tuesday and get b-met drawn. Avoid foods containing potassium.

## 2015-05-16 NOTE — Telephone Encounter (Signed)
-----   Message from Lennette Biharihomas A Kelly, MD sent at 05/11/2015 10:01 PM EDT ----- Re-check K; avoid foodswith inc K; if still elevated on recheck may need to dc altace; HbA1c 6.6

## 2015-05-16 NOTE — Telephone Encounter (Signed)
Returning your call from yesterday. °

## 2015-05-22 LAB — BASIC METABOLIC PANEL
BUN: 23 mg/dL (ref 6–23)
CALCIUM: 9.4 mg/dL (ref 8.4–10.5)
CHLORIDE: 104 meq/L (ref 96–112)
CO2: 31 meq/L (ref 19–32)
CREATININE: 0.77 mg/dL (ref 0.50–1.35)
GLUCOSE: 113 mg/dL — AB (ref 70–99)
Potassium: 5.1 mEq/L (ref 3.5–5.3)
Sodium: 140 mEq/L (ref 135–145)

## 2015-05-23 ENCOUNTER — Telehealth: Payer: Self-pay | Admitting: *Deleted

## 2015-05-23 NOTE — Telephone Encounter (Signed)
-----   Message from Thomas A Kelly, MD sent at 05/11/2015 10:01 PM EDT ----- Re-check K; avoid foodswith inc K; if still elevated on recheck may need to dc altace; HbA1c 6.6 

## 2015-05-23 NOTE — Telephone Encounter (Signed)
Called patient to confirm that he received previously left message about lab results and recommendations. He did confirm receipt of message. Blood redrawn. I looked up those results and advised patient. Stated to patient that Dr. Tresa EndoKelly has not reviewed the repeat labs yet. Once he has I will notify him of any changes in therapy if recommended. Patient voiced understanding.

## 2015-05-27 ENCOUNTER — Encounter: Payer: Self-pay | Admitting: *Deleted

## 2015-07-21 ENCOUNTER — Other Ambulatory Visit: Payer: Self-pay | Admitting: *Deleted

## 2015-07-21 MED ORDER — EZETIMIBE 10 MG PO TABS: 10.0000 mg | ORAL_TABLET | Freq: Every day | ORAL | Status: DC

## 2015-07-21 MED ORDER — ISOSORBIDE MONONITRATE ER 30 MG PO TB24: 30.0000 mg | ORAL_TABLET | Freq: Every day | ORAL | Status: DC

## 2015-07-21 MED ORDER — METOPROLOL TARTRATE 50 MG PO TABS: 50.0000 mg | ORAL_TABLET | Freq: Two times a day (BID) | ORAL | Status: DC

## 2015-07-21 MED ORDER — ATORVASTATIN CALCIUM 40 MG PO TABS: 40.0000 mg | ORAL_TABLET | Freq: Every day | ORAL | Status: DC

## 2015-07-21 MED ORDER — CLOPIDOGREL BISULFATE 75 MG PO TABS: 75.0000 mg | ORAL_TABLET | Freq: Every day | ORAL | Status: DC

## 2015-07-21 MED ORDER — AMLODIPINE BESYLATE 5 MG PO TABS: 5.0000 mg | ORAL_TABLET | Freq: Every day | ORAL | Status: DC

## 2015-07-21 MED ORDER — RAMIPRIL 2.5 MG PO CAPS: 2.5000 mg | ORAL_CAPSULE | Freq: Every day | ORAL | Status: DC

## 2015-07-21 NOTE — Telephone Encounter (Signed)
Metoprolol, amlodipine, lipitor, ramipril, zetia, isosorbide, plavix Rx(s) sent to pharmacy electronically.

## 2016-01-14 IMAGING — CR DG HIP (WITH OR WITHOUT PELVIS) 2-3V*R*
3 series · 3 of 3 positions shown · non-contrast
Comparison: None available.

CLINICAL DATA: Fall 1 month ago.

EXAM:
RIGHT HIP (WITH PELVIS) 2-3 VIEWS

[w pelvis upright]
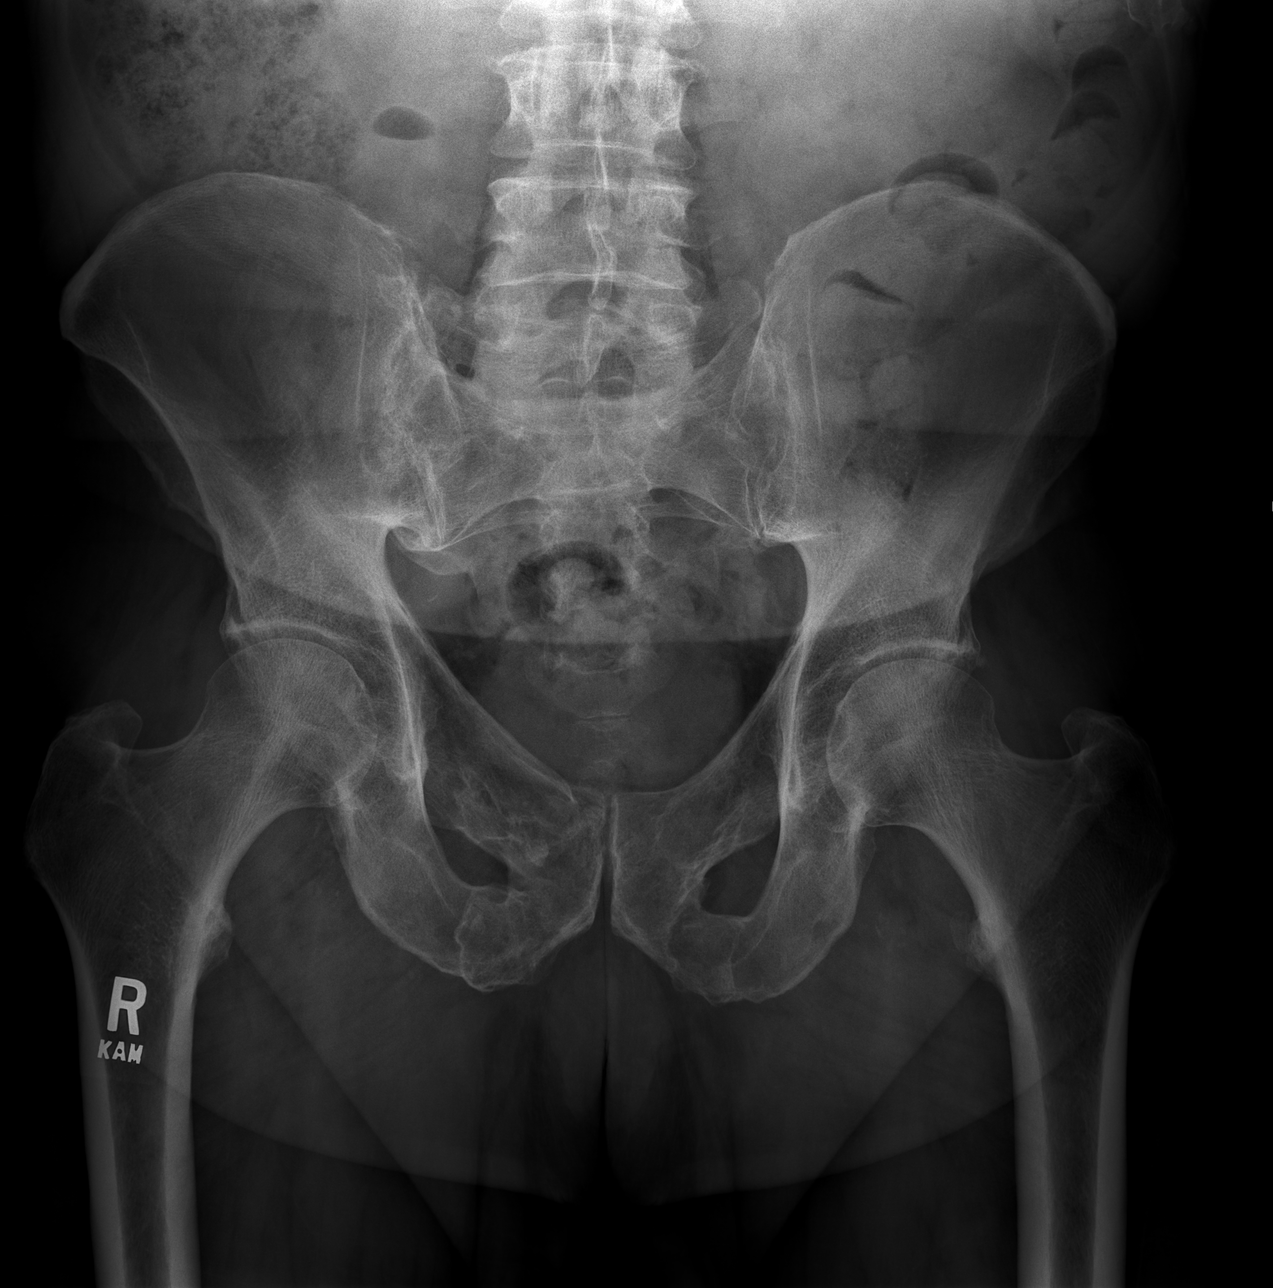

[w hip ap right]
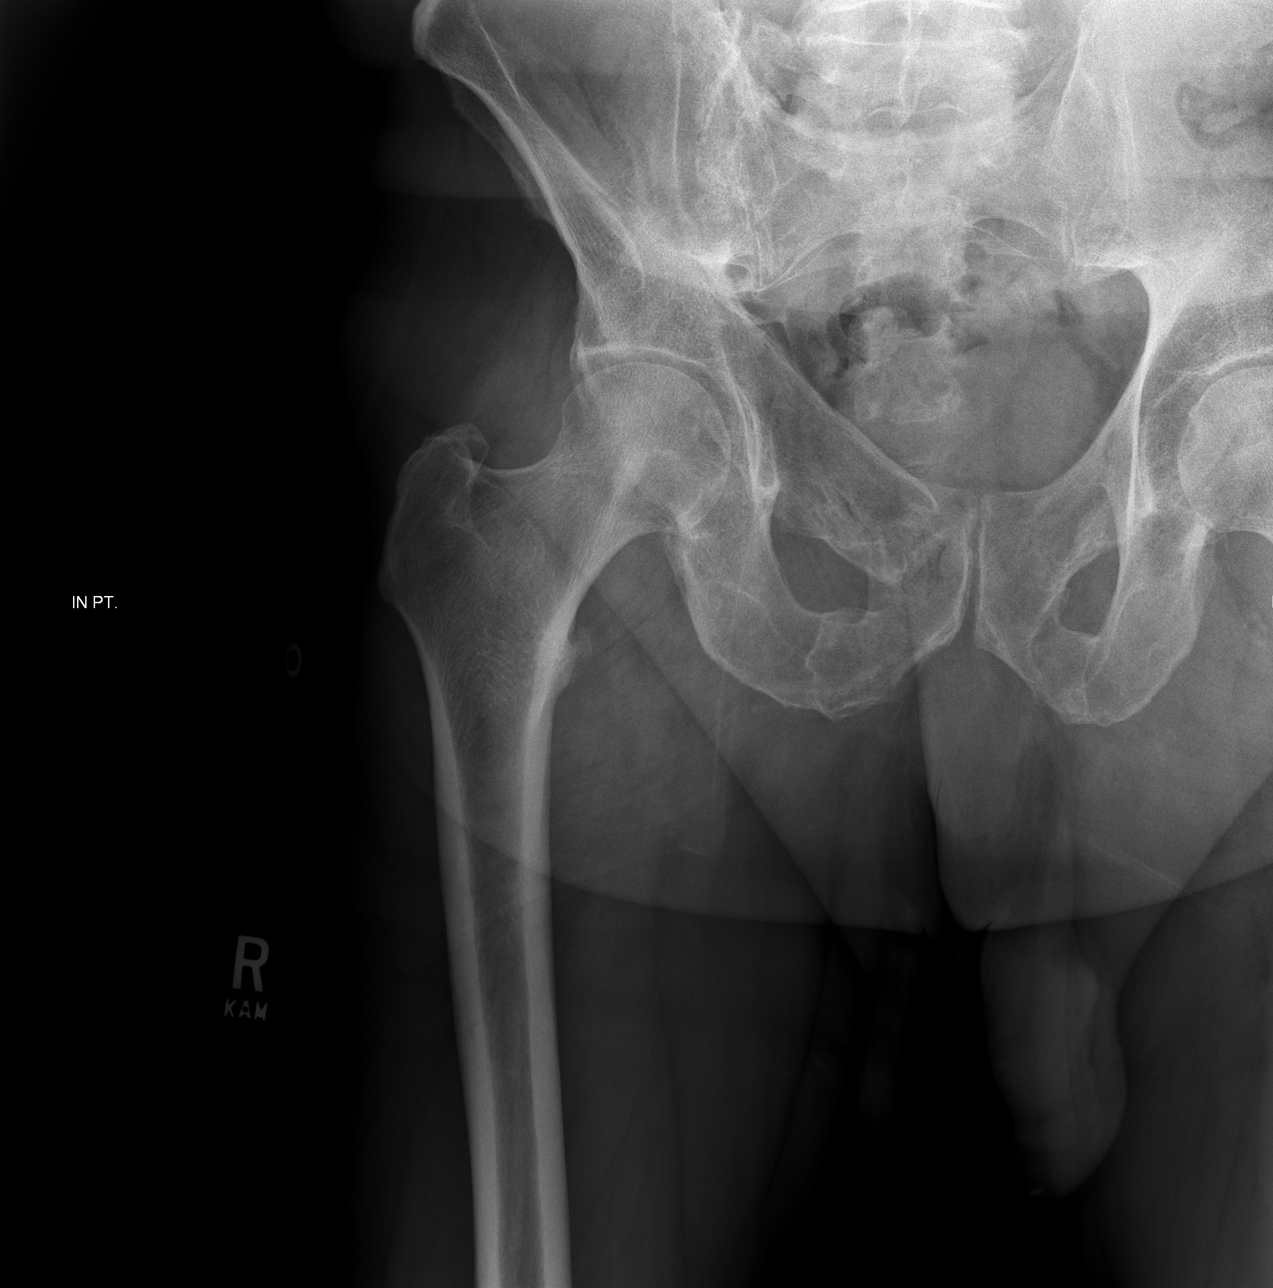

[w hip lat right]
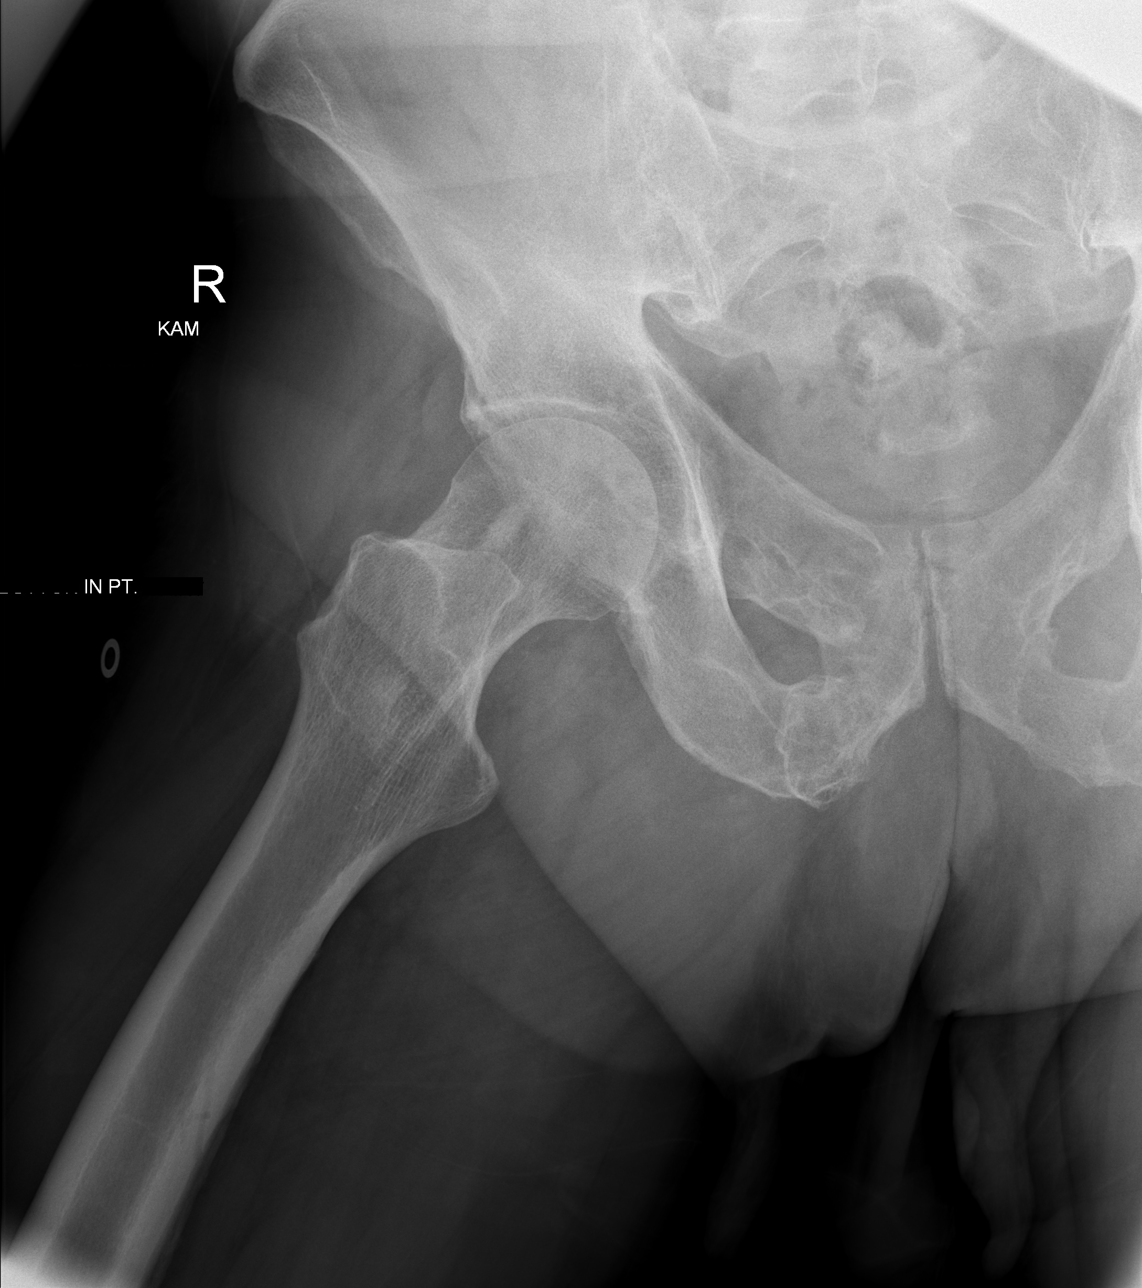

[3 of 3 positions shown; findings below may reference images not displayed]

FINDINGS: Degenerative changes lumbar spine and both hips. Deformity noted of
the superior inferior pubic rami bilaterally suggesting prior healed
fractures. Superimposed pubic rami acute fractures cannot be
entirely excluded. No other focal abnormality.
IMPRESSION: 1. Degenerative changes lumbar spine and both hips.
2. Deformity of the superior inferior pubic rami bilaterally
suggesting old healed fractures. Superimposed pubic rami acute
fractures cannot be entirely excluded.

## 2016-04-05 ENCOUNTER — Ambulatory Visit (INDEPENDENT_AMBULATORY_CARE_PROVIDER_SITE_OTHER): Payer: Medicare Other | Admitting: Cardiovascular Disease

## 2016-04-05 ENCOUNTER — Encounter: Payer: Self-pay | Admitting: Cardiovascular Disease

## 2016-04-05 VITALS — BP 120/70 | HR 52 | Ht 73.5 in | Wt 207.6 lb

## 2016-04-05 DIAGNOSIS — I10 Essential (primary) hypertension: Secondary | ICD-10-CM

## 2016-04-05 DIAGNOSIS — E785 Hyperlipidemia, unspecified: Secondary | ICD-10-CM | POA: Diagnosis not present

## 2016-04-05 DIAGNOSIS — I2510 Atherosclerotic heart disease of native coronary artery without angina pectoris: Secondary | ICD-10-CM | POA: Diagnosis not present

## 2016-04-05 DIAGNOSIS — E119 Type 2 diabetes mellitus without complications: Secondary | ICD-10-CM | POA: Diagnosis not present

## 2016-04-05 MED ORDER — CLOPIDOGREL BISULFATE 75 MG PO TABS: 75.0000 mg | ORAL_TABLET | Freq: Every day | ORAL | Status: AC

## 2016-04-05 MED ORDER — ISOSORBIDE MONONITRATE ER 30 MG PO TB24: 30.0000 mg | ORAL_TABLET | Freq: Every day | ORAL | Status: AC

## 2016-04-05 MED ORDER — EZETIMIBE 10 MG PO TABS: 10.0000 mg | ORAL_TABLET | Freq: Every day | ORAL | Status: DC

## 2016-04-05 MED ORDER — ATORVASTATIN CALCIUM 40 MG PO TABS: 40.0000 mg | ORAL_TABLET | Freq: Every day | ORAL | Status: AC

## 2016-04-05 MED ORDER — AMLODIPINE BESYLATE 5 MG PO TABS: 5.0000 mg | ORAL_TABLET | Freq: Every day | ORAL | Status: AC

## 2016-04-05 MED ORDER — RAMIPRIL 2.5 MG PO CAPS: 2.5000 mg | ORAL_CAPSULE | Freq: Every day | ORAL | Status: AC

## 2016-04-05 MED ORDER — METOPROLOL TARTRATE 50 MG PO TABS: 50.0000 mg | ORAL_TABLET | Freq: Two times a day (BID) | ORAL | Status: AC

## 2016-04-05 NOTE — Patient Instructions (Signed)
Your physician recommends that you schedule a follow-up appointment and exercise myoview in 1 year.

## 2016-04-06 ENCOUNTER — Encounter: Payer: Self-pay | Admitting: Cardiovascular Disease

## 2016-04-06 NOTE — Progress Notes (Signed)
Patient ID: Carl Keith, male   DOB: 05-31-1941, 75 y.o.   MRN: [id number]      HPI: Carl Keith is a 75 y.o. male who presents to the office today for one year cardiology evaluation.  Last year he retired from his position at [employer] in [employer].  Since his wife has passed away over the past year, he has moved to [facility] [place].  Carl Keith has established CAD and suffered a small heart attack initially at age 61 while living in [place].  In 1992 underwent CABG surgery x4 by Dr. Redmond Pulling after suffering a non-ST segment elevation myocardial infarction. In March 2007 he was found to have significant  native CAD disease with an ectatic left main with 30% stenosis, 50% stenosis at the ostia of the LAD, occlusion of the marginals arising from the native circumflex and total occlusion of the RCA. He had a patent vein graft supplying a large bifurcating diagonal system proximally and another patent graft supplying the distal diagonal LAD system but beyond the graft insertion was diffusely diseased occluded apical LAD portion. There is another graft that had been occluded. The vein graft supplying the PDA had was patent but there was diffuse PDA stenosis and a 03/11/2006 he underwent successful PCI with stenting to the PDA with insertion of 2.5x23 mm Cypher stent. Subsequent nuclear perfusion studies has have continued to show a small area of anteroapical scar concordant with his distal LAD occlusion. His last stress study was in December 2013 which was unchanged. Post-rest ejection fraction was 59%.   Over the past year, Carl Keith has continued to be active.  He denies any chest pain or shortness of breath.  He continues to walk at least 2 miles per day.  Last year he retired from his job at [employer] after working many years in [employer].  Unfortunately last year his 58 year old wife died with lung cancer.  Several months ago he moved to [place] and lives in [place], [place].  He tells me still wishes to see me on a yearly basis for his Cardiologic care  He has a history of diabetes mellitus and has been on metformin 500 mg daily.  He has been on ramipril 2.5 mg, metoprolol 50 g twice a day, isosorbide mononitrate 30 mg daily in addition to amlodipine 5 mg for his hypertension and CAD.  He is on atorvastatin 40 mg and Zetia 10 mg for hyperlipidemia.  He is on Flomax for BPH.  Past Medical History  Diagnosis Date  . MI (myocardial infarction) ([place])     non-Q-wave, By Dr Redmond Pulling  . CAD (coronary artery disease)   . Hx of echocardiogram 06/2009    which showed mild asymmetric LVH with low-normal EF50-55%, he had grade 1 diastolic dysfuntion, there was evidence for mild tricupid regurgitation, he had mild right atrial dilation and mild mitral annular calcification with trace MR.  [name] Hypertension   . Hyperlipidemia   . History of stress test 08/2008    which show a defect in the mid distal apical anterior wall campatible with his distal LAD occlusion, perfusion was otherwise faily normal with mild thinning inferiorly.  [name] History of stress test 11/2012    is unchanged shows a small in size, mild intensity defect without evidence for ischemia, 59% EF.    Past Surgical History  Procedure Laterality Date  . Coronary artery bypass graft  1992    x4 by Dr Redmond Pulling  . Cardiac  catheterization  2007    he underwent stenting of his PDA vessel beyond his SVG to RCA graft 2.5 x 23 mm    No Known Allergies  Current Outpatient Prescriptions  Medication Sig Dispense Refill  . amLODipine (NORVASC) 5 MG tablet Take 1 tablet (5 mg total) by mouth daily. 90 tablet 3  . aspirin 81 MG tablet Take 81 mg by mouth daily.    [name] atorvastatin (LIPITOR) 40 MG tablet Take 1 tablet (40 mg total) by mouth at bedtime. 90 tablet 3  . BAYER MICROLET LANCETS lancets     . clopidogrel (PLAVIX) 75 MG tablet Take 1 tablet (75 mg total) by mouth daily. 90 tablet 3  . ezetimibe (ZETIA) 10 MG  tablet Take 1 tablet (10 mg total) by mouth daily. 90 tablet 3  . isosorbide mononitrate (IMDUR) 30 MG 24 hr tablet Take 1 tablet (30 mg total) by mouth daily. 90 tablet 3  . metFORMIN (GLUCOPHAGE) 500 MG tablet Take 500 mg by mouth daily.    . metoprolol (LOPRESSOR) 50 MG tablet Take 1 tablet (50 mg total) by mouth 2 (two) times daily. 180 tablet 3  . ramipril (ALTACE) 2.5 MG capsule Take 1 capsule (2.5 mg total) by mouth daily. 90 capsule 3  . tamsulosin (FLOMAX) 0.4 MG CAPS capsule Take 1 capsule by mouth at bedtime.  1  . vitamin C (ASCORBIC ACID) 500 MG tablet Take 500 mg by mouth daily.    . vitamin E 400 UNIT capsule Take 400 Units by mouth daily.     No current facility-administered medications for this visit.    Social History   Social History  . Marital Status: Married    Spouse Name: N/A  . Number of Children: N/A  . Years of Education: N/A   Occupational History  . Not on file.   Social History Main Topics  . Smoking status: Former Research scientist (life sciences)  . Smokeless tobacco: Never Used     Comment: quit aprox 10 years ago  . Alcohol Use: 1.2 oz/week    2 Standard drinks or equivalent per week     Comment: ocasional  . Drug Use: Not on file  . Sexual Activity: Not on file   Other Topics Concern  . Not on file   Social History Narrative    Family History  Problem Relation Age of Onset  . Diabetes Mother   . Cancer Father    ROS General: Negative; No fevers, chills, or night sweats;  HEENT: Negative; No changes in vision or hearing, sinus congestion, difficulty swallowing Pulmonary: Negative; No cough, wheezing, shortness of breath, hemoptysis Cardiovascular: See history of present illness GI: Negative; No nausea, vomiting, diarrhea, or abdominal pain GU: History of BPH Musculoskeletal: Negative; no myalgias, joint pain, or weakness Hematologic/Oncology: Negative; no easy bruising, bleeding Endocrine: Positive for diabetes mellitus; no heat/cold intolerance Neuro:  Negative; no changes in balance, headaches Skin: Negative; No rashes or skin lesions Psychiatric: Negative; No behavioral problems, depression Sleep: Negative; No snoring, daytime sleepiness, hypersomnolence, bruxism, restless legs, hypnogognic hallucinations, no cataplexy Other comprehensive 14  Point system review is negative.  PE BP 120/70 mmHg  Pulse 52  Ht 6' 1.5" (1.867 m)  Wt 207 lb 9.6 oz (94.167 kg)  BMI 27.02 kg/m2   Wt Readings from Last 3 Encounters:  04/05/16 207 lb 9.6 oz (94.167 kg)  03/24/15 198 lb 9.6 oz (90.084 kg)  04/18/14 194 lb 4.8 oz (88.134 kg)   General: Alert, oriented, no distress; appears  younger than stated age Skin: normal turgor, no rashes HEENT: Normocephalic, atraumatic. Pupils round and reactive; sclera anicteric;no lid lag.  Nose without nasal septal hypertrophy Mouth/Parynx benign; Mallinpatti scale 2 Neck: No JVD, no carotid bruits with normal upstroke Chest wall: Nontender to palpation Lungs: clear to ausculatation and percussion; no wheezing or rales Heart: RRR, s1 s2 normal 1/6 systolic murmur, unchanged.  No S3 or S4 gallop.  No diastolic murmurs rubs thrills or heaves Abdomen: soft, nontender; no hepatosplenomehaly, BS+; abdominal aorta nontender and not dilated by palpation. Back: No CVA tenderness Pulses 2+ Extremities: no clubbing cyanosis or edema, Homan's sign negative  Neurologic: grossly nonfocal Psychological: Normal affect and mood  ECG (independently read by me): Sinus bradycardia 52 bpm.  Poor anterior R-wave progression V1 through V4.  April 2016 ECG (independently read by me): Sinus bradycardia 53 bpm.  No ectopy.  Poor anterior R-wave progression V1 through V3.  April 2015 ECG today (apparently read by me) NSR at 50 beats per minute.  No ectopy.  Normal intervals.  Prior 08/17/2039 ECG: Sinus bradycardia 50 beats per minute. No ectopy. QTc interval 399 ms  LABS:  I have personally reviewed.  Blood work which he had  done on 12/01/2015 by Dr. Milagros Evener.  Total cholesterol 104, triglycerides 48, HDL 41, LDL 53, and non-HDL 63. Hemoglobin A1c 6.8.  BMP Latest Ref Rng 05/21/2015 05/06/2015 04/18/2014  Glucose 70 - 99 mg/dL 113(H) 89 102(H)  BUN 6 - 23 mg/dL _0 Creatinine 0.50 - 1.35 mg/dL 0.77 0.79 0.86  Sodium 135 - 145 mEq/L 140 140 141  Potassium 3.5 - 5.3 mEq/L 5.1 5.7(H) 5.2  Chloride 96 - 112 mEq/L 104 105 105  CO2 19 - 32 mEq/L _1 Calcium 8.4 - 10.5 mg/dL 9.4 9.1 9.5    Hepatic Function Latest Ref Rng 05/06/2015 04/18/2014  Total Protein 6.0 - 8.3 g/dL 6.4 6.7  Albumin 3.5 - 5.2 g/dL 4.1 4.6  AST 0 - 37 U/L 30 38(H)  ALT 0 - 53 U/L 28 37  Alk Phosphatase 39 - 117 U/L 44 54  Total Bilirubin 0.2 - 1.2 mg/dL 1.0 1.0    CBC Latest Ref Rng 05/06/2015 04/18/2014  WBC 4.0 - 10.5 K/uL 4.9 5.2  Hemoglobin 13.0 - 17.0 g/dL 15.0 15.8  Hematocrit 39.0 - 52.0 % 43.4 44.8  Platelets 150 - 400 K/uL 129(L) 151    Lab Results  Component Value Date   MCV 88.2 05/06/2015   MCV 87.0 04/18/2014    Lab Results  Component Value Date   TSH 2.876 05/06/2015   Lab Results  Component Value Date   HGBA1C 6.6* 05/06/2015   BNP No results found for: PROBNP   Lipid Panel     Component Value Date/Time   CHOL 97 05/06/2015 0918   TRIG 60 05/06/2015 0918   HDL 45 05/06/2015 0918   CHOLHDL 2.2 05/06/2015 0918   VLDL 12 05/06/2015 0918   LDLCALC 40 05/06/2015 0918     RADIOLOGY: No results found.    ASSESSMENT AND PLAN: Carl Keith is a 75 year old gentleman who is 25 years status post revascularization surgery x4. He has documented distal LAD occlusion beyond the graft which is responsible for a small region of antero-apical scar. He presently is not having anginal symptoms. He is bradycardic but remains asymptomatic.  He remains active and is walking 2 miles per day without change in symptoms.  His blood pressure today is well controlled on his  current regimen consisting of  amlodipine 5 mg, metoprolol 50 g twice a day, ramipril 2.5 mg, and isosorbide mononitrate 30 mg.  He's not having any anginal symptoms.  His lipid studies are excellent and when I had seen him last year, I discontinued his niacin.  His triglycerides remain low at 48.  LDL continues to be excellent at 53.  He is diabetic on metformin.  His last nuclear perfusion study was in December 2013.  He now resides at the [facility] on [address] and wishes to continue to see me for his Cardiologic care.  I also have suggested that he also establish with a local physician in the event of any unforeseen emergencies.  Next year will be 5 years since his last nuclear perfusion study, and I have recommended a follow-up exercise Myoview scan, which will be done prior to his office visit with me.  Time spent: 25 minutes Troy Sine, MD, [facility]  04/06/2016 9:16 PM

## 2016-05-18 ENCOUNTER — Telehealth: Payer: Self-pay | Admitting: Cardiovascular Disease

## 2016-05-18 MED ORDER — EZETIMIBE 10 MG PO TABS: 10.0000 mg | ORAL_TABLET | Freq: Every day | ORAL | Status: AC

## 2016-05-18 NOTE — Telephone Encounter (Signed)
Refill sent to the pharmacy electronically.  

## 2016-05-18 NOTE — Telephone Encounter (Signed)
New Message   *STAT* If patient is at the pharmacy, call can be transferred to refill team.   1. Which medications need to be refilled? (please list name of each medication and dose if known) ezetimibe (ZETIA) 10 MG tablet   2. Which pharmacy/location (including street and city if local pharmacy) is medication to be sent to? [facility] [address] [place] Phone :  [phone] Fax: [phone]  3. Do they need a 30 day or 90 day supply? 90  Comments: all other medication were set up for 90 day supply for 1 year except for this medication and would like to change the pharmacy.
